# Patient Record
Sex: Female | Born: 1945
Health system: Southern US, Community
[De-identification: ages and names within clinical notes are randomized; demographics above are authoritative.]

## PROBLEM LIST (undated history)

## (undated) DIAGNOSIS — Z87442 Personal history of urinary calculi: Secondary | ICD-10-CM

## (undated) DIAGNOSIS — J330 Polyp of nasal cavity: Secondary | ICD-10-CM

## (undated) DIAGNOSIS — I10 Essential (primary) hypertension: Secondary | ICD-10-CM

## (undated) HISTORY — PX: TUBAL LIGATION: SHX77

---

## 2017-10-30 ENCOUNTER — Encounter: Payer: Self-pay | Admitting: Emergency Medicine

## 2017-10-30 ENCOUNTER — Emergency Department
Admission: EM | Admit: 2017-10-30 | Discharge: 2017-10-30 | Disposition: A | Discharge status: Home / Self Care | Payer: Medicare Other | Attending: Emergency Medicine | Admitting: Emergency Medicine

## 2017-10-30 ENCOUNTER — Telehealth: Payer: Self-pay | Admitting: Urology

## 2017-10-30 ENCOUNTER — Emergency Department: Payer: Medicare Other

## 2017-10-30 DIAGNOSIS — R1030 Lower abdominal pain, unspecified: Secondary | ICD-10-CM

## 2017-10-30 DIAGNOSIS — N23 Unspecified renal colic: Secondary | ICD-10-CM | POA: Insufficient documentation

## 2017-10-30 DIAGNOSIS — I10 Essential (primary) hypertension: Secondary | ICD-10-CM | POA: Insufficient documentation

## 2017-10-30 DIAGNOSIS — R112 Nausea with vomiting, unspecified: Secondary | ICD-10-CM

## 2017-10-30 DIAGNOSIS — K862 Cyst of pancreas: Secondary | ICD-10-CM

## 2017-10-30 HISTORY — DX: Essential (primary) hypertension: I10

## 2017-10-30 LAB — CBC
HCT: 42.5 % (ref 35.0–47.0)
Hemoglobin: 14.8 g/dL (ref 12.0–16.0)
MCH: 32 pg (ref 26.0–34.0)
MCHC: 34.9 g/dL (ref 32.0–36.0)
MCV: 91.8 fL (ref 80.0–100.0)
Platelets: 168 10*3/uL (ref 150–440)
RBC: 4.63 MIL/uL (ref 3.80–5.20)
RDW: 13.4 % (ref 11.5–14.5)
WBC: 10.4 10*3/uL (ref 3.6–11.0)

## 2017-10-30 LAB — COMPREHENSIVE METABOLIC PANEL
ALT: 18 U/L (ref 0–44)
AST: 22 U/L (ref 15–41)
Albumin: 4.5 g/dL (ref 3.5–5.0)
Alkaline Phosphatase: 57 U/L (ref 38–126)
Anion gap: 13 (ref 5–15)
BUN: 16 mg/dL (ref 8–23)
CO2: 26 mmol/L (ref 22–32)
Calcium: 9.8 mg/dL (ref 8.9–10.3)
Chloride: 101 mmol/L (ref 98–111)
Creatinine, Ser: 1.03 mg/dL — ABNORMAL HIGH (ref 0.44–1.00)
GFR calc Af Amer: >60 mL/min (ref >60)
GFR calc non Af Amer: 53 mL/min — ABNORMAL LOW (ref >60)
Glucose, Bld: 176 mg/dL — ABNORMAL HIGH (ref 70–99)
Potassium: 4.1 mmol/L (ref 3.5–5.1)
Sodium: 140 mmol/L (ref 135–145)
Total Bilirubin: 0.9 mg/dL (ref 0.3–1.2)
Total Protein: 7.2 g/dL (ref 6.5–8.1)

## 2017-10-30 LAB — URINALYSIS, COMPLETE (UACMP) WITH MICROSCOPIC
Bilirubin Urine: NEGATIVE
Glucose, UA: NEGATIVE mg/dL
Ketones, ur: 5 mg/dL — AB
Leukocytes, UA: NEGATIVE
Nitrite: NEGATIVE
Protein, ur: NEGATIVE mg/dL
RBC / HPF: >50 RBC/hpf — ABNORMAL HIGH (ref 0–5)
Specific Gravity, Urine: 1.027 (ref 1.005–1.030)
pH: 7 (ref 5.0–8.0)

## 2017-10-30 LAB — LIPASE, BLOOD: Lipase: 31 U/L (ref 11–51)

## 2017-10-30 MED ORDER — ONDANSETRON 4 MG PO TBDP: ORAL_TABLET | ORAL | 0 refills | Status: DC

## 2017-10-30 MED ORDER — ONDANSETRON HCL 4 MG/2ML IJ SOLN: 4.0000 mg | INTRAMUSCULAR | Status: DC

## 2017-10-30 MED ORDER — ONDANSETRON HCL 4 MG/2ML IJ SOLN
4.0000 mg | Freq: Once | INTRAMUSCULAR | Status: AC | PRN
  Administered 2017-10-30: 4 mg via INTRAVENOUS
  Filled 2017-10-30: qty 2

## 2017-10-30 MED ORDER — HYDROCODONE-ACETAMINOPHEN 5-325 MG PO TABS: 1.0000 | ORAL_TABLET | ORAL | 0 refills | Status: DC | PRN

## 2017-10-30 MED ORDER — TAMSULOSIN HCL 0.4 MG PO CAPS: ORAL_CAPSULE | ORAL | 0 refills | Status: DC

## 2017-10-30 MED ORDER — DOCUSATE SODIUM 100 MG PO CAPS: ORAL_CAPSULE | ORAL | 0 refills | Status: DC

## 2017-10-30 MED ORDER — IOPAMIDOL (ISOVUE-300) INJECTION 61%
100.0000 mL | Freq: Once | INTRAVENOUS | Status: AC | PRN
  Administered 2017-10-30: 100 mL via INTRAVENOUS
  Filled 2017-10-30: qty 100

## 2017-10-30 NOTE — Telephone Encounter (Signed)
This patient case was discussed with Dr. York CeriseForbach.  She presented with abdominal pain found to have an 8 millimeter UPJ stone.  No signs or symptoms of infection.  She is now appropriate for discharge.  Given the size and location of the stone, surgical intervention will likely be required.  Unfortunately, she is been taking daily Aleve thus ESWL is contraindicated for tomorrow.  She would be a good candidate for this next week.  Please arrange for her to be seen ASAP in our office by any provider prior to next Thursday for further evaluation.  He will discharge her home with pain medication, Flomax and Zofran with close urologic follow-up.  She was advised to avoid any NSAIDs at least 72 hours prior to next Thursday.  Alicia ScotlandAshley Bode Pieper, MD

## 2017-10-30 NOTE — Discharge Instructions (Addendum)
You have been seen in the Emergency Department (ED) today for pain that we believe based on your workup, is caused by kidney stones.  As we have discussed, please drink plenty of fluids.  Please make a follow up appointment with the physician(s) listed elsewhere in this documentation.  You may take pain medication as needed but ONLY as prescribed.  Please also take your prescribed Flomax daily.  We also recommend that you take over-the-counter ibuprofen regularly according to label instructions over the next 5 days.  Take it with meals to minimize stomach discomfort.  Please see your doctor as soon as possible as stones may take 1-3 weeks to pass and you may require additional care or medications.  Do not drink alcohol, drive or participate in any other potentially dangerous activities while taking opiate pain medication as it may make you sleepy. Do not take this medication with any other sedating medications, either prescription or over-the-counter. If you were prescribed Percocet or Vicodin, do not take these with acetaminophen (Tylenol) as it is already contained within these medications.   Take Norco as needed for severe pain.  This medication is an opiate (or narcotic) pain medication and can be habit forming.  Use it as little as possible to achieve adequate pain control.  Do not use or use it with extreme caution if you have a history of opiate abuse or dependence.  If you are on a pain contract with your primary care doctor or a pain specialist, be sure to let them know you were prescribed this medication today from the [facility].  This medication is intended for your use only - do not give any to anyone else and keep it in a secure place where nobody else, especially children, have access to it.  It will also cause or worsen constipation, so you may want to consider taking an over-the-counter stool softener while you are taking this medication.  As we discussed, you  also have a cyst on your pancreas.  This is likely benign, but the radiologist recommended that you have follow-up imaging (a pre-and postcontrast MRI/MRCP or pancreatic protocol CT scan) in 2 years to reevaluate the cyst to make sure it is not growing.  Please discuss this with your primary care provider.  Return to the Emergency Department (ED) or call your doctor if you have any worsening pain, fever, painful urination, are unable to urinate, or develop other symptoms that concern you.

## 2017-10-30 NOTE — ED Notes (Signed)
IV removed from pt left AC 

## 2017-10-30 NOTE — ED Notes (Signed)
Patient transported to CT 

## 2017-10-30 NOTE — ED Triage Notes (Signed)
PT arrives with complaints of lower abdominal pain, nausea, and vomiting. Pt reports 8 episodes of emesis. PT reports no relief with OTC medication. Pt states symptoms started last night

## 2017-10-30 NOTE — ED Provider Notes (Signed)
[facility] Emergency Department Provider Note  ____________________________________________   First MD Initiated Contact with Patient 10/30/17 1442     (approximate)  I have reviewed the triage vital signs and the nursing notes.   HISTORY  Chief Complaint Abdominal Pain    HPI Alicia Nelson is a 72 y.o. female who is quite healthy for her age and who is only known chronic diagnosis is hypertension who presents with her daughter for evaluation of acute onset abdominal pain with persistent vomiting.  The patient reports that for the last couple of days she has had some "on and off" aching in her abdomen.  However when she woke up this morning she had moderate lower abdominal pain that is dull and aching and has had at least 8 episodes of emesis.  She says she has not been able to keep anything down including sips of water.  She says that she had a normal bowel movement this morning before the abdominal pain started but has not had a bowel movement or passed any gas since it started.  She feels a little bit better after getting some Zofran in triage but says that she still has some mild aching and mild persistent nausea.    Her only prior abdominal surgery was a tubal ligation many years ago.  She denies recent fever, chest pain, shortness of breath, upper abdominal pain, dysuria, and vaginal bleeding.  No one else is sick and the symptoms did not seem to start after eating anything in particular.  Her bowel movement this morning was normal with no evidence of diarrhea.  No recent blood in stool.  Past Medical History:  Diagnosis Date  . Hypertension     There are no active problems to display for this patient.   History reviewed. No pertinent surgical history.  Prior to Admission medications   Medication Sig Start Date End Date Taking? Authorizing Provider  docusate sodium (COLACE) 100 MG capsule Take 1 tablet once or twice daily as needed for constipation  while taking narcotic pain medicine 10/30/17   Loleta Rose, MD  HYDROcodone-acetaminophen (NORCO/VICODIN) 5-325 MG tablet Take 1-2 tablets by mouth every 4 (four) hours as needed for moderate pain. 10/30/17   Loleta Rose, MD  ondansetron (ZOFRAN ODT) 4 MG disintegrating tablet Allow 1-2 tablets to dissolve in your mouth every 8 hours as needed for nausea/vomiting 10/30/17   Loleta Rose, MD  tamsulosin [facility]) 0.4 MG CAPS capsule Take 1 tablet by mouth daily until you pass the kidney stone or no longer have symptoms 10/30/17   Loleta Rose, MD    Allergies Patient has no allergy information on record.  No family history on file.  Social History Social History   Tobacco Use  . Smoking status: Never Smoker  . Smokeless tobacco: Never Used  Substance Use Topics  . Alcohol use: Never    Frequency: Never  . Drug use: Never    Review of Systems Constitutional: No fever/chills Eyes: No visual changes. ENT: No sore throat. Cardiovascular: Denies chest pain. Respiratory: Denies shortness of breath. Gastrointestinal: Abdominal pain with multiple episodes of vomiting as described above.  No diarrhea, normal bowel movement before pain but no passage of gas or stool since the onset of the pain and vomiting. Genitourinary: Negative for dysuria. Musculoskeletal: Negative for neck pain.  Negative for back pain. Integumentary: Negative for rash. Neurological: Negative for headaches, focal weakness or numbness.   ____________________________________________   PHYSICAL EXAM:  VITAL SIGNS: ED Triage Vitals  Enc Vitals Group     BP 10/30/17 1334 109/88     Pulse Rate 10/30/17 1334 75     Resp 10/30/17 1334 20     Temp 10/30/17 1334 (!) 97.3 F (36.3 C)     Temp Source 10/30/17 1334 Oral     SpO2 10/30/17 1334 99 %     Weight 10/30/17 1335 59 kg (130 lb)     Height 10/30/17 1335 1.575 m (5\' 2" )     Head Circumference --      Peak Flow --      Pain Score 10/30/17 1335 7     Pain  Loc --      Pain Edu? --      Excl. in GC? --     Constitutional: Alert and oriented.  Appears somewhat uncomfortable but is not in distress Eyes: Conjunctivae are normal.  Head: Atraumatic. Nose: No congestion/rhinnorhea. Mouth/Throat: Mucous membranes are moist. Neck: No stridor.  No meningeal signs.   Cardiovascular: Normal rate, regular rhythm. Good peripheral circulation. Grossly normal heart sounds. Respiratory: Normal respiratory effort.  No retractions. Lungs CTAB. Gastrointestinal: Soft and nondistended.  Mild generalized tenderness to palpation throughout without any focal tenderness. Musculoskeletal: No lower extremity tenderness nor edema. No gross deformities of extremities. Neurologic:  Normal speech and language. No gross focal neurologic deficits are appreciated.  Skin:  Skin is warm, dry and intact. No rash noted. Psychiatric: Mood and affect are normal. Speech and behavior are normal.  ____________________________________________   LABS (all labs ordered are listed, but only abnormal results are displayed)  Labs Reviewed  COMPREHENSIVE METABOLIC PANEL - Abnormal; Notable for the following components:      Result Value   Glucose, Bld 176 (*)    Creatinine, Ser 1.03 (*)    GFR calc non Af Amer 53 (*)    All other components within normal limits  URINALYSIS, COMPLETE (UACMP) WITH MICROSCOPIC - Abnormal; Notable for the following components:   Color, Urine YELLOW (*)    APPearance CLEAR (*)    Hgb urine dipstick SMALL (*)    Ketones, ur 5 (*)    RBC / HPF >50 (*)    Bacteria, UA RARE (*)    All other components within normal limits  LIPASE, BLOOD  CBC   ____________________________________________  EKG  None - EKG not ordered by ED physician ____________________________________________  RADIOLOGY   ED MD interpretation: 8 mm UPJ stone on the right with hydronephrosis.  Hepatic cysts (already known to the patient).  Pancreatic cyst as well of unknown  clinical significance.  It  Official radiology report(s): Ct Abdomen Pelvis W Contrast  Result Date: 10/30/2017 CLINICAL DATA:  Initial evaluation for acute bilateral lower abdominal pain, nausea, vomiting. EXAM: CT ABDOMEN AND PELVIS WITH CONTRAST TECHNIQUE: Multidetector CT imaging of the abdomen and pelvis was performed using the standard protocol following bolus administration of intravenous contrast. CONTRAST:  ISOVUE-300 IOPAMIDOL (ISOVUE-300) INJECTION 61% COMPARISON:  None available. FINDINGS: Lower chest: Scattered subsegmental atelectatic changes present within the visualized lung bases. Visualized lungs are otherwise clear. Prominent coronary artery calcifications noted. Hepatobiliary: Multiple cysts seen within the liver, largest of which positioned at the hepatic dome and measures 4.5 cm in size. Liver otherwise unremarkable. Gallbladder within normal limits. No biliary dilatation. Pancreas: Several small cystic lesions present within the pancreas, most discrete of which positioned at the pancreatic tail and measures up to 8 mm (series 2, image 33). No abnormal pancreatic ductal dilatation. No acute peripancreatic  inflammation. Spleen: Spleen within normal limits. Adrenals/Urinary Tract: Adrenal glands are normal. Kidneys equal in size. 3.4 cm cyst present within the interpolar right kidney. Few additional scattered subcentimeter hypodensities too small the characterize, but statistically likely reflects small cysts as well. No focal enhancing renal masses. Punctate nonobstructive stone present within the interpolar left kidney. On the right, there is an 8 mm obstructive stone lodged at the right UPJ with secondary moderate right hydronephrosis. Right ureter mildly dilated distally. Additional nonobstructive stones measuring up to 6 mm present within the right kidney. No other radiopaque calculi identified. Partially distended bladder within normal limits. No layering stones within the  bladder lumen. Stomach/Bowel: Large hiatal hernia with the majority of the stomach position within the lower thorax. Circumferential wall thickening about the gastric antrum, which could be related to vomiting or gastritis. No evidence for bowel obstruction. Colonic diverticulosis without evidence for acute diverticulitis. No other acute inflammatory changes about the bowels. No findings to suggest acute appendicitis. Vascular/Lymphatic: Diffuse tortuosity the intra-abdominal aorta with moderate aorto bi-iliac atherosclerotic disease. No aneurysm. Mesenteric vessels patent proximally. No adenopathy. Reproductive: Enlarged heterogeneous fibroid uterus, with large calcified fibroid measuring 8.1 x 5.2 x 5.7 cm. Uterus otherwise unremarkable. 13 mm right adnexal cyst noted, of doubtful significance. Ovaries otherwise unremarkable. Other: No free air or fluid. Musculoskeletal: Remotely healed fracture of the left superior and inferior pubic rami noted. Multiple chronic compression deformities seen throughout the visualized thoracolumbar spine. No acute osseous abnormality. No discrete lytic or blastic osseous lesions. Osteopenia noted. IMPRESSION: 1. 8 mm obstructive stone at the right UPJ with secondary moderate right hydronephrosis. Additional bilateral nonobstructive nephrolithiasis as above. 2. Large hiatal hernia with the majority of the stomach position within the lower thorax. Mild wall thickening about the gastric antrum may be related to vomiting and/or mild acute gastritis. 3. Colonic diverticulosis without evidence for acute diverticulitis. 4. Moderate to advanced atherosclerosis with coronary artery calcifications. 5. Fibroid uterus. 6. **An incidental finding of potential clinical significance has been found. Several small pancreatic cysts measuring up to 8 mm in diameter. Recommend follow up pre and post contrast MRI/MRCP or pancreatic protocol CT in 2 years. This recommendation follows ACR consensus  guidelines: Management of Incidental Pancreatic Cysts: A White Paper of the ACR Incidental Findings Committee. J Am Coll Radiol 2017;14:[id number].** Electronically Signed   By: Rise MuBenjamin  McClintock M.D.   On: 10/30/2017 16:01    ____________________________________________   PROCEDURES  Critical Care performed: No   Procedure(s) performed:   Procedures   ____________________________________________   INITIAL IMPRESSION / ASSESSMENT AND PLAN / ED COURSE  As part of my medical decision making, I reviewed the following data within the electronic MEDICAL RECORD NUMBER History obtained from family, Nursing notes reviewed and incorporated, Labs reviewed , Old chart reviewed, Discussed with urologist and reviewed Notes from prior ED visits    Differential diagnosis includes, but is not limited to, SBO/ileus, nonspecific gastritis, viral or bacterial GI infection, less likely but still possibly appendicitis or diverticulitis.  The patient on the acute onset and the description of an inability to keep anything down and not passing any gas or stool since the onset of the symptoms this morning, I strongly suspect  SBO, but it is notable that the patient has no distention and only minimal tenderness to palpation diffusely throughout her abdomen.  Given her inability to tolerate oral intake at this time, I am obtaining a CT scan with IV contrast only.  Her vital signs are all reassuring,  and her CMP, lipase, and CBC are all within normal limits.  She and her daughter understand and agree with the plan and I will reassess after imaging.  Clinical Course as of Oct 30 1801  Wed Oct 30, 2017  1700 CT scan is notable for an 8 mm UPJ stone on the right with hydronephrosis.  Pancreatic cyst as well and I let the patient and her daughter know about it for follow-up in 2 years as recommended by radiology.  The patient Artie knew about her hepatic cysts.The patient is feeling much better and I am going to try p.o.  challenge to see if she would be able to tolerate outpatient treatment.  I am going to touch base with urology to see if the patient would be eligible for lithotripsy tomorrow in clinic.   [CF]  1705 No vomiting after PO challenge, patient says she feel much better.  Awaiting callback from Dr. Apolinar Junes.   [CF]  1730 I spoke with Dr. Apolinar Junes by phone.  Unfortunately the patient took an Aleve this morning, and she has to be without NSAIDs for 72 hours before lithotripsy due to the position of the stone.  Dr. Apolinar Junes will let her office staff know to reach out to schedule a close follow-up appointment and then will plan for lithotripsy next week.  She had no other management recommendations at this time and agree that empiric antibiotics were not necessary based on the information I provided.I have dated the patient and her daughter and stressed the importance of not taking any NSAIDs (ibuprofen, naproxen, nor aspirin) after Sunday.  I also gave my usual customary return precautions.  They understand and agree.   [CF]    Clinical Course User Index [CF] Derico Mitton, MD    ____________________________________________  FINAL CLINICAL IMPRESSION(S) / ED DIAGNOSES  Final diagnoses:  Ureteral colic  Non-intractable vomiting with nausea, unspecified vomiting type  Lower abdominal pain  Pancreatic cyst     MEDICATIONS GIVEN DURING THIS VISIT:  Medications  ondansetron (ZOFRAN) injection 4 mg (4 mg Intravenous Given 10/30/17 1340)  ondansetron (ZOFRAN) injection 4 mg (4 mg Intravenous Given 10/30/17 1500)  iopamidol (ISOVUE-300) 61 % injection 100 mL (100 mLs Intravenous Contrast Given 10/30/17 1505)     ED Discharge Orders         Ordered    ondansetron (ZOFRAN ODT) 4 MG disintegrating tablet     10/30/17 1800    HYDROcodone-acetaminophen (NORCO/VICODIN) 5-325 MG tablet  Every 4 hours PRN     10/30/17 1800    docusate sodium (COLACE) 100 MG capsule     10/30/17 1800    tamsulosin (FLOMAX)  0.4 MG CAPS capsule     08 /28/19 1800           Note:  This document was prepared using Dragon voice recognition software and may include unintentional dictation errors.    Loleta Rose, MD 10/30/17 [phone]

## 2017-11-05 NOTE — Telephone Encounter (Signed)
App has been made ° ° °Alicia Nelson °

## 2017-11-06 ENCOUNTER — Ambulatory Visit: Payer: Medicare HMO | Admitting: Urology

## 2017-11-06 ENCOUNTER — Encounter: Payer: Self-pay | Admitting: Urology

## 2017-11-06 ENCOUNTER — Other Ambulatory Visit: Payer: Self-pay | Admitting: Radiology

## 2017-11-06 VITALS — BP 91/59 | HR 85 | Ht 62.0 in | Wt 130.0 lb

## 2017-11-06 DIAGNOSIS — N201 Calculus of ureter: Secondary | ICD-10-CM

## 2017-11-06 DIAGNOSIS — N1330 Unspecified hydronephrosis: Secondary | ICD-10-CM | POA: Diagnosis not present

## 2017-11-06 DIAGNOSIS — N200 Calculus of kidney: Secondary | ICD-10-CM | POA: Diagnosis not present

## 2017-11-06 LAB — URINALYSIS, COMPLETE
Bilirubin, UA: NEGATIVE
Glucose, UA: NEGATIVE
Nitrite, UA: NEGATIVE
Specific Gravity, UA: 1.02 (ref 1.005–1.030)
Urobilinogen, Ur: 0.2 mg/dL (ref 0.2–1.0)
pH, UA: 5.5 (ref 5.0–7.5)

## 2017-11-06 LAB — MICROSCOPIC EXAMINATION

## 2017-11-06 MED ORDER — CIPROFLOXACIN HCL 500 MG PO TABS
500.0000 mg | ORAL_TABLET | ORAL | Status: AC
  Administered 2017-11-07: 500 mg via ORAL

## 2017-11-06 NOTE — Progress Notes (Signed)
11/06/2017 4:47 PM   Alicia Nelson 01/29/1946 [id number]  Referring provider: No referring provider defined for this encounter.  Chief Complaint  Patient presents with  . Nephrolithiasis    discuss ESWL    HPI: 72 year old female who presents today to discuss management of 8 mm right UPJ stone with moderate right hydroureteronephrosis.  She was seen last week in the emergency room with bilateral abdominal pain nausea and vomiting.  As part of her work-up for this, she underwent CT abdomen with contrast looking for abdominal sources.  This revealed an incidental 8 mm right obstructing UPJ stone with secondary moderate right hydroureteronephrosis.  She does have additional bilateral nonobstructing stones.  She last had right flank pain on Monday.  She does not like taking pain pills and she feels like the pain pills meter sicker than the pain from the stone itself.  She denies any fevers, chills, dysuria, gross hematuria, or any other complaints other than as per above.  Hounsfield units 750.  Stone to skin distance 9 cm.  No personal history history of kidney stones.    PMH: Past Medical History:  Diagnosis Date  . Hypertension     Surgical History: No past surgical history on file.  Home Medications:  Allergies as of 11/06/2017   Not on File     Medication List        Accurate as of 11/06/17  4:47 PM. Always use your most recent med list.          docusate sodium 100 MG capsule Commonly known as:  COLACE Take 1 tablet once or twice daily as needed for constipation while taking narcotic pain medicine   HYDROcodone-acetaminophen 5-325 MG tablet Commonly known as:  NORCO/VICODIN Take 1-2 tablets by mouth every 4 (four) hours as needed for moderate pain.   ondansetron 4 MG disintegrating tablet Commonly known as:  ZOFRAN-ODT Allow 1-2 tablets to dissolve in your mouth every 8 hours as needed for nausea/vomiting   tamsulosin 0.4 MG Caps capsule Commonly known as:   FLOMAX Take 1 tablet by mouth daily until you pass the kidney stone or no longer have symptoms       Allergies: Not on File  Family History: No family history on file.  Social History:  reports that she has never smoked. She has never used smokeless tobacco. She reports that she does not drink alcohol or use drugs.  ROS: UROLOGY Frequent Urination?: No Hard to postpone urination?: No Burning/pain with urination?: No Get up at night to urinate?: No Leakage of urine?: No Urine stream starts and stops?: No Trouble starting stream?: No Do you have to strain to urinate?: No Blood in urine?: No Urinary tract infection?: No Sexually transmitted disease?: No Injury to kidneys or bladder?: No Painful intercourse?: No Weak stream?: No Currently pregnant?: No Vaginal bleeding?: No Last menstrual period?: n  Gastrointestinal Nausea?: No Vomiting?: No Indigestion/heartburn?: No Diarrhea?: No Constipation?: No  Constitutional Fever: No Night sweats?: No Weight loss?: No Fatigue?: No  Skin Skin rash/lesions?: No Itching?: No  Eyes Blurred vision?: No Double vision?: No  Ears/Nose/Throat Sore throat?: No Sinus problems?: No  Hematologic/Lymphatic Swollen glands?: No Easy bruising?: No  Cardiovascular Leg swelling?: No Chest pain?: No  Respiratory Cough?: No Shortness of breath?: No  Endocrine Excessive thirst?: No  Musculoskeletal Back pain?: No Joint pain?: No  Neurological Headaches?: No Dizziness?: No  Psychologic Depression?: No Anxiety?: No  Physical Exam: BP (!) 91/59   Pulse 85   Ht  5\' 2"  (1.575 m)   Wt 130 lb (59 kg)   BMI 23.78 kg/m   Constitutional:  Alert and oriented, No acute distress.  Accompanied by daughter today. HEENT: [place] AT, moist mucus membranes.  Trachea midline, no masses. Cardiovascular: No clubbing, cyanosis, or edema. Respiratory: Normal respiratory effort, no increased work of breathing. GI: Abdomen is soft,  nontender, nondistended, no abdominal masses GU: No CVA tenderness Skin: No rashes, bruises or suspicious lesions. Neurologic: Grossly intact, no focal deficits, moving all 4 extremities. Psychiatric: Normal mood and affect.  Laboratory Data: Lab Results  Component Value Date   WBC 10.4 10/30/2017   HGB 14.8 10/30/2017   HCT 42.5 10/30/2017   MCV 91.8 10/30/2017   PLT 168 10/30/2017    Lab Results  Component Value Date   CREATININE 1.03 (H) 10/30/2017    Urinalysis Results for orders placed or performed in visit on 11/06/17  Microscopic Examination  Result Value Ref Range   WBC, UA 6-10 (A) 0 - 5 /hpf   RBC, UA 3-10 (A) 0 - 2 /hpf   Epithelial Cells (non renal) 0-10 0 - 10 /hpf   Renal Epithel, UA 0-10 (A) None seen /hpf   Casts Present (A) None seen /lpf   Cast Type Hyaline casts N/A   Crystals Present (A) N/A   Crystal Type Calcium Oxalate N/A   Mucus, UA Present (A) Not Estab.   Bacteria, UA Moderate (A) None seen/Few  Urinalysis, Complete  Result Value Ref Range   Specific Gravity, UA 1.020 1.005 - 1.030   pH, UA 5.5 5.0 - 7.5   Color, UA Yellow Yellow   Appearance Ur Clear Clear   Leukocytes, UA 1+ (A) Negative   Protein, UA 1+ (A) Negative/Trace   Glucose, UA Negative Negative   Ketones, UA 1+ (A) Negative   RBC, UA Trace (A) Negative   Bilirubin, UA Negative Negative   Urobilinogen, Ur 0.2 0.2 - 1.0 mg/dL   Nitrite, UA Negative Negative   Microscopic Examination See below:      Pertinent Imaging: CLINICAL DATA:  Initial evaluation for acute bilateral lower abdominal pain, nausea, vomiting.  EXAM: CT ABDOMEN AND PELVIS WITH CONTRAST  TECHNIQUE: Multidetector CT imaging of the abdomen and pelvis was performed using the standard protocol following bolus administration of intravenous contrast.  CONTRAST:  ISOVUE-300 IOPAMIDOL (ISOVUE-300) INJECTION 61%  COMPARISON:  None available.  FINDINGS: Lower chest: Scattered subsegmental  atelectatic changes present within the visualized lung bases. Visualized lungs are otherwise clear. Prominent coronary artery calcifications noted.  Hepatobiliary: Multiple cysts seen within the liver, largest of which positioned at the hepatic dome and measures 4.5 cm in size. Liver otherwise unremarkable. Gallbladder within normal limits. No biliary dilatation.  Pancreas: Several small cystic lesions present within the pancreas, most discrete of which positioned at the pancreatic tail and measures up to 8 mm (series 2, image 33). No abnormal pancreatic ductal dilatation. No acute peripancreatic inflammation.  Spleen: Spleen within normal limits.  Adrenals/Urinary Tract: Adrenal glands are normal. Kidneys equal in size. 3.4 cm cyst present within the interpolar right kidney. Few additional scattered subcentimeter hypodensities too small the characterize, but statistically likely reflects small cysts as well. No focal enhancing renal masses. Punctate nonobstructive stone present within the interpolar left kidney. On the right, there is an 8 mm obstructive stone lodged at the right UPJ with secondary moderate right hydronephrosis. Right ureter mildly dilated distally. Additional nonobstructive stones measuring up to 6 mm present within the right  kidney. No other radiopaque calculi identified. Partially distended bladder within normal limits. No layering stones within the bladder lumen.  Stomach/Bowel: Large hiatal hernia with the majority of the stomach position within the lower thorax. Circumferential wall thickening about the gastric antrum, which could be related to vomiting or gastritis. No evidence for bowel obstruction. Colonic diverticulosis without evidence for acute diverticulitis. No other acute inflammatory changes about the bowels. No findings to suggest acute appendicitis.  Vascular/Lymphatic: Diffuse tortuosity the intra-abdominal aorta with moderate aorto  bi-iliac atherosclerotic disease. No aneurysm. Mesenteric vessels patent proximally. No adenopathy.  Reproductive: Enlarged heterogeneous fibroid uterus, with large calcified fibroid measuring 8.1 x 5.2 x 5.7 cm. Uterus otherwise unremarkable. 13 mm right adnexal cyst noted, of doubtful significance. Ovaries otherwise unremarkable.  Other: No free air or fluid.  Musculoskeletal: Remotely healed fracture of the left superior and inferior pubic rami noted. Multiple chronic compression deformities seen throughout the visualized thoracolumbar spine. No acute osseous abnormality. No discrete lytic or blastic osseous lesions. Osteopenia noted.  IMPRESSION: 1. 8 mm obstructive stone at the right UPJ with secondary moderate right hydronephrosis. Additional bilateral nonobstructive nephrolithiasis as above. 2. Large hiatal hernia with the majority of the stomach position within the lower thorax. Mild wall thickening about the gastric antrum may be related to vomiting and/or mild acute gastritis. 3. Colonic diverticulosis without evidence for acute diverticulitis. 4. Moderate to advanced atherosclerosis with coronary artery calcifications. 5. Fibroid uterus. 6. **An incidental finding of potential clinical significance has been found. Several small pancreatic cysts measuring up to 8 mm in diameter. Recommend follow up pre and post contrast MRI/MRCP or pancreatic protocol CT in 2 years. This recommendation follows ACR consensus guidelines: Management of Incidental Pancreatic Cysts: A White Paper of the ACR Incidental Findings Committee. J Am Coll Radiol 2017;14:911-923.**   Electronically Signed   By: Rise Mu M.D.   On: 10/30/2017 16:01  CT scan was personally reviewed today.  Assessment & Plan:    1. Obstruction of right ureteropelvic junction (UPJ) due to stone Symptomatic obstructing 8 mm right UPJ stone without concern for infection Given the size and  location of the stone, I would recommend surgical intervention this is unlikely to pass on its own with medical expulsive therapy We discussed various treatment options including ESWL vs. ureteroscopy, laser lithotripsy, and stent. We discussed the risks and benefits of both including bleeding, infection, damage to surrounding structures, efficacy with need for possible further intervention, and need for temporary ureteral stent. She is most interested shockwave lithotripsy given that is minimally invasive.  She is an excellent candidate for this based on the stone parameters, her habitus, and the location of the stone. All questions were answered today in detail.  Lithotripsy paperwork was completed. - Urinalysis, Complete - CULTURE, URINE COMPREHENSIVE  2. Hydronephrosis of right kidney Secondary to #1  Vanna Scotland, MD  [facility] [address], [address] [place], [place] [address] [phone]

## 2017-11-07 ENCOUNTER — Encounter
Admission: RE | Disposition: A | Discharge status: Home / Self Care | Payer: Self-pay | Source: Ambulatory Visit | Attending: Urology

## 2017-11-07 ENCOUNTER — Other Ambulatory Visit: Payer: Self-pay

## 2017-11-07 ENCOUNTER — Encounter: Payer: Self-pay | Admitting: *Deleted

## 2017-11-07 ENCOUNTER — Ambulatory Visit: Payer: Medicare HMO

## 2017-11-07 ENCOUNTER — Ambulatory Visit
Admission: RE | Admit: 2017-11-07 | Discharge: 2017-11-07 | Disposition: A | Discharge status: Home / Self Care | Payer: Medicare HMO | Source: Ambulatory Visit | Attending: Urology | Admitting: Urology

## 2017-11-07 DIAGNOSIS — N201 Calculus of ureter: Secondary | ICD-10-CM | POA: Diagnosis not present

## 2017-11-07 DIAGNOSIS — I10 Essential (primary) hypertension: Secondary | ICD-10-CM | POA: Insufficient documentation

## 2017-11-07 DIAGNOSIS — N132 Hydronephrosis with renal and ureteral calculous obstruction: Secondary | ICD-10-CM | POA: Diagnosis not present

## 2017-11-07 DIAGNOSIS — N200 Calculus of kidney: Secondary | ICD-10-CM | POA: Diagnosis present

## 2017-11-07 HISTORY — PX: EXTRACORPOREAL SHOCK WAVE LITHOTRIPSY: SHX1557

## 2017-11-07 SURGERY — LITHOTRIPSY, ESWL
Anesthesia: Moderate Sedation | Laterality: Right

## 2017-11-07 MED ORDER — ONDANSETRON HCL 4 MG/2ML IJ SOLN
4.0000 mg | Freq: Once | INTRAMUSCULAR | Status: AC | PRN
  Administered 2017-11-07: 4 mg via INTRAVENOUS

## 2017-11-07 MED ORDER — CIPROFLOXACIN HCL 500 MG PO TABS
ORAL_TABLET | ORAL | Status: AC
  Administered 2017-11-07: 500 mg via ORAL
  Filled 2017-11-07: qty 1

## 2017-11-07 MED ORDER — DIAZEPAM 5 MG PO TABS
10.0000 mg | ORAL_TABLET | ORAL | Status: AC
  Administered 2017-11-07: 10 mg via ORAL

## 2017-11-07 MED ORDER — DIPHENHYDRAMINE HCL 25 MG PO CAPS
ORAL_CAPSULE | ORAL | Status: AC
  Administered 2017-11-07: 25 mg via ORAL
  Filled 2017-11-07: qty 1

## 2017-11-07 MED ORDER — SODIUM CHLORIDE 0.9 % IV SOLN
INTRAVENOUS | Status: DC
  Administered 2017-11-07: 09:00:00 via INTRAVENOUS

## 2017-11-07 MED ORDER — DIPHENHYDRAMINE HCL 25 MG PO CAPS
25.0000 mg | ORAL_CAPSULE | ORAL | Status: AC
  Administered 2017-11-07: 25 mg via ORAL

## 2017-11-07 MED ORDER — DIAZEPAM 5 MG PO TABS
ORAL_TABLET | ORAL | Status: AC
  Administered 2017-11-07: 10 mg via ORAL
  Filled 2017-11-07: qty 2

## 2017-11-07 MED ORDER — ONDANSETRON HCL 4 MG/2ML IJ SOLN
INTRAMUSCULAR | Status: AC
  Administered 2017-11-07: 4 mg via INTRAVENOUS
  Filled 2017-11-07: qty 2

## 2017-11-07 NOTE — H&P (View-Only) (Signed)
11/07/17  Patient was taken for shockwave lithotripsy today.  However, the stone was unable to be identified.  This was either secondary to interval passage of the stone although the stone could have been obstructed by a large amount of bowel gas as well as additional calcifications within the abdomen.  Contrast was administered to help identify the stone.  This revealed persistent moderate hydroureteronephrosis all the way down to the level of a fibroid which may be obstructive.  In retrospect on CT scan, her ureter was dilated even distal to the stone.  I discussed this with the patient but primarily her daughter at length today.  I would like to take her to the operating room for cystoscopy, right retrograde pyelogram, ureteroscopy with possible laser lithotripsy for retained stone is identified as well as ureteral stent placement.  If she does have hydronephrosis secondary to a compressive fibroid, she will be referred to GYN for consideration of resection of this.  All questions were answered.  Follow-up surgery was booked.  Vanna Scotland, MD

## 2017-11-07 NOTE — Progress Notes (Signed)
11/07/17  Patient was taken for shockwave lithotripsy today.  However, the stone was unable to be identified.  This was either secondary to interval passage of the stone although the stone could have been obstructed by a large amount of bowel gas as well as additional calcifications within the abdomen.  Contrast was administered to help identify the stone.  This revealed persistent moderate hydroureteronephrosis all the way down to the level of a fibroid which may be obstructive.  In retrospect on CT scan, her ureter was dilated even distal to the stone.  I discussed this with the patient but primarily her daughter at length today.  I would like to take her to the operating room for cystoscopy, right retrograde pyelogram, ureteroscopy with possible laser lithotripsy for retained stone is identified as well as ureteral stent placement.  If she does have hydronephrosis secondary to a compressive fibroid, she will be referred to GYN for consideration of resection of this.  All questions were answered.  Follow-up surgery was booked.  Kairee Isa, MD  

## 2017-11-07 NOTE — Interval H&P Note (Signed)
History and Physical Interval Note:  11/07/2017 12:27 PM  Alicia Nelson  has presented today for surgery, with the diagnosis of Kidney stone  The various methods of treatment have been discussed with the patient and family. After consideration of risks, benefits and other options for treatment, the patient has consented to  Procedure(s): EXTRACORPOREAL SHOCK WAVE LITHOTRIPSY (ESWL) (Right) as a surgical intervention .  The patient's history has been reviewed, patient examined, no change in status, stable for surgery.  I have reviewed the patient's chart and labs.  Questions were answered to the patient's satisfaction.     Vanna Scotland

## 2017-11-07 NOTE — H&P (View-Only) (Signed)
11/07/17  Patient was taken for shockwave lithotripsy today.  However, the stone was unable to be identified.  This was either secondary to interval passage of the stone although the stone could have been obstructed by a large amount of bowel gas as well as additional calcifications within the abdomen.  Contrast was administered to help identify the stone.  This revealed persistent moderate hydroureteronephrosis all the way down to the level of a fibroid which may be obstructive.  In retrospect on CT scan, her ureter was dilated even distal to the stone.  I discussed this with the patient but primarily her daughter at length today.  I would like to take her to the operating room for cystoscopy, right retrograde pyelogram, ureteroscopy with possible laser lithotripsy for retained stone is identified as well as ureteral stent placement.  If she does have hydronephrosis secondary to a compressive fibroid, she will be referred to GYN for consideration of resection of this.  All questions were answered.  Follow-up surgery was booked.  Adalin Vanderploeg, MD  

## 2017-11-07 NOTE — Discharge Instructions (Signed)

## 2017-11-08 LAB — CULTURE, URINE COMPREHENSIVE

## 2017-11-11 ENCOUNTER — Telehealth: Payer: Self-pay | Admitting: Radiology

## 2017-11-11 ENCOUNTER — Other Ambulatory Visit: Payer: Self-pay | Admitting: Radiology

## 2017-11-11 DIAGNOSIS — N1330 Unspecified hydronephrosis: Secondary | ICD-10-CM

## 2017-11-11 NOTE — Telephone Encounter (Signed)
LMOM to notify patient of right ureteroscopy, stent placement, retrograde pyelogram & possible laser lithotripsy scheduled with Dr Apolinar Junes on 11/18/2017 as well as pre-admit testing appointment on 11/14/2017 at 8:00.

## 2017-11-13 ENCOUNTER — Encounter: Payer: Self-pay | Admitting: Urology

## 2017-11-13 ENCOUNTER — Telehealth: Payer: Self-pay | Admitting: Urology

## 2017-11-13 NOTE — Telephone Encounter (Signed)
Discussed appointments with patient. Questions answered. Patient voices understanding.

## 2017-11-13 NOTE — Telephone Encounter (Signed)
Insurance Authorization for Outpatient Surgery CPT [phone] - Right ureterscopy, stent placement CPT [phone] - Right retrograde pyelogram CPT 52356 - possible LL Diag: N13.30 = right hydronephrosis DOS: 11/18/17 Dr. Vanna Scotland  Per [employer] (#[phone]), pre-certification is NOT required. Reference # [id number] [facility] 11/13/2017 10:20am

## 2017-11-14 ENCOUNTER — Encounter
Admission: RE | Admit: 2017-11-14 | Discharge: 2017-11-14 | Disposition: A | Discharge status: Home / Self Care | Payer: Medicare HMO | Source: Ambulatory Visit | Attending: Urology | Admitting: Urology

## 2017-11-14 ENCOUNTER — Other Ambulatory Visit: Payer: Self-pay

## 2017-11-14 DIAGNOSIS — I10 Essential (primary) hypertension: Secondary | ICD-10-CM | POA: Diagnosis not present

## 2017-11-14 HISTORY — DX: Polyp of nasal cavity: J33.0

## 2017-11-14 HISTORY — DX: Personal history of urinary calculi: Z87.442

## 2017-11-14 NOTE — Patient Instructions (Addendum)
Your procedure is scheduled on: 11/18/17 Mon Report to Same Day Surgery 2nd floor medical mall [facility](Medical Mall Entrance-take elevator on left to 2nd floor.  Check in with surgery information desk.) To find out your arrival time please call [phone] between 1PM - 3PM on 11/15/17 Fri  Remember: Instructions that are not followed completely may result in serious medical risk, up to and including death, or upon the discretion of your surgeon and anesthesiologist your surgery may need to be rescheduled.    _x___ 1. Do not eat food after midnight the night before your procedure. You may drink clear liquids up to 2 hours before you are scheduled to arrive at the hospital for your procedure.  Do not drink clear liquids within 2 hours of your scheduled arrival to the hospital.  Clear liquids include  --Water or Apple juice without pulp  --Clear carbohydrate beverage such as ClearFast or Gatorade  --Black Coffee or Clear Tea (No milk, no creamers, do not add anything to                  the coffee or Tea Type 1 and type 2 diabetics should only drink water.   ____Ensure clear carbohydrate drink on the way to the hospital for bariatric patients  ____Ensure clear carbohydrate drink 3 hours before surgery for Dr Rutherford NailByrnett's patients if physician instructed.   No gum chewing or hard candies.     __x__ 2. No Alcohol for 24 hours before or after surgery.   __x__3. No Smoking or e-cigarettes for 24 prior to surgery.  Do not use any chewable tobacco products for at least 6 hour prior to surgery   ____  4. Bring all medications with you on the day of surgery if instructed.    __x__ 5. Notify your doctor if there is any change in your medical condition     (cold, fever, infections).    x___6. On the morning of surgery brush your teeth with toothpaste and water.  You may rinse your mouth with mouth wash if you wish.  Do not swallow any toothpaste or mouthwash.   Do not wear jewelry, make-up, hairpins,  clips or nail polish.  Do not wear lotions, powders, or perfumes. You may wear deodorant.  Do not shave 48 hours prior to surgery. Men may shave face and neck.  Do not bring valuables to the hospital.    [facility] not responsible for any belongings or valuables.               Contacts, dentures or bridgework may not be worn into surgery.  Leave your suitcase in the car. After surgery it may be brought to your room.  For patients admitted to the hospital, discharge time is determined by your                       treatment team.  _  Patients discharged the day of surgery will not be allowed to drive home.  You will need someone to drive you home and stay with you the night of your procedure.    Please read over the following fact sheets that you were given:   [facility] Preparing for Surgery and or MRSA Information   _x___ Take anti-hypertensive listed below, cardiac, seizure, asthma,     anti-reflux and psychiatric medicines. These include:  1. None  2.  3.  4.  5.  6.  ____Fleets enema or Magnesium Citrate as directed.  ____ Use CHG Soap or sage wipes as directed on instruction sheet   ____ Use inhalers on the day of surgery and bring to hospital day of surgery  ____ Stop Metformin and Janumet 2 days prior to surgery.    ____ Take 1/2 of usual insulin dose the night before surgery and none on the morning     surgery.   _x___ Follow recommendations from Cardiologist, Pulmonologist or PCP regarding          stopping Aspirin, Coumadin, Plavix ,Eliquis, Effient, or Pradaxa, and Pletal.  X____Stop Anti-inflammatories such as Advil, Aleve, Ibuprofen, Motrin, Naproxen, Naprosyn, Goodies powders or aspirin products. OK to take Tylenol and                          Celebrex.   _x___ Stop supplements until after surgery.  But may continue Vitamin D, Vitamin B,       and multivitamin.   ____ Bring C-Pap to the hospital.

## 2017-11-17 MED ORDER — CEFAZOLIN SODIUM-DEXTROSE 2-4 GM/100ML-% IV SOLN
2.0000 g | INTRAVENOUS | Status: AC
  Administered 2017-11-18: 2 g via INTRAVENOUS

## 2017-11-18 ENCOUNTER — Ambulatory Visit: Payer: Medicare HMO | Admitting: Anesthesiology

## 2017-11-18 ENCOUNTER — Other Ambulatory Visit: Payer: Self-pay

## 2017-11-18 ENCOUNTER — Ambulatory Visit
Admission: RE | Admit: 2017-11-18 | Discharge: 2017-11-18 | Disposition: A | Discharge status: Home / Self Care | Payer: Medicare HMO | Source: Ambulatory Visit | Attending: Urology | Admitting: Urology

## 2017-11-18 ENCOUNTER — Encounter: Payer: Self-pay | Admitting: Emergency Medicine

## 2017-11-18 ENCOUNTER — Encounter
Admission: RE | Disposition: A | Discharge status: Home / Self Care | Payer: Self-pay | Source: Ambulatory Visit | Attending: Urology

## 2017-11-18 DIAGNOSIS — N132 Hydronephrosis with renal and ureteral calculous obstruction: Secondary | ICD-10-CM | POA: Insufficient documentation

## 2017-11-18 DIAGNOSIS — I10 Essential (primary) hypertension: Secondary | ICD-10-CM | POA: Diagnosis not present

## 2017-11-18 DIAGNOSIS — Z79899 Other long term (current) drug therapy: Secondary | ICD-10-CM | POA: Insufficient documentation

## 2017-11-18 DIAGNOSIS — N1330 Unspecified hydronephrosis: Secondary | ICD-10-CM | POA: Diagnosis not present

## 2017-11-18 HISTORY — PX: CYSTOSCOPY/URETEROSCOPY/HOLMIUM LASER/STENT PLACEMENT: SHX6546

## 2017-11-18 HISTORY — PX: CYSTOSCOPY W/ RETROGRADES: SHX1426

## 2017-11-18 SURGERY — CYSTOSCOPY, WITH RETROGRADE PYELOGRAM
Anesthesia: General | Site: Ureter | Laterality: Right

## 2017-11-18 MED ORDER — HYDROCODONE-ACETAMINOPHEN 5-325 MG PO TABS: 1.0000 | ORAL_TABLET | ORAL | 0 refills | Status: DC | PRN

## 2017-11-18 MED ORDER — PROPOFOL 10 MG/ML IV BOLUS
INTRAVENOUS | Status: DC | PRN
  Administered 2017-11-18: 140 mg via INTRAVENOUS

## 2017-11-18 MED ORDER — IOPAMIDOL (ISOVUE-200) INJECTION 41%
INTRAVENOUS | Status: DC | PRN
  Administered 2017-11-18: 15 mL

## 2017-11-18 MED ORDER — FAMOTIDINE 20 MG PO TABS
ORAL_TABLET | ORAL | Status: AC
  Filled 2017-11-18: qty 1

## 2017-11-18 MED ORDER — FENTANYL CITRATE (PF) 100 MCG/2ML IJ SOLN
INTRAMUSCULAR | Status: AC
  Filled 2017-11-18: qty 2

## 2017-11-18 MED ORDER — PHENYLEPHRINE HCL 10 MG/ML IJ SOLN
INTRAMUSCULAR | Status: DC | PRN
  Administered 2017-11-18 (×2): 50 ug via INTRAVENOUS

## 2017-11-18 MED ORDER — ONDANSETRON HCL 4 MG/2ML IJ SOLN: 4.0000 mg | Freq: Once | INTRAMUSCULAR | Status: DC | PRN

## 2017-11-18 MED ORDER — SUGAMMADEX SODIUM 200 MG/2ML IV SOLN
INTRAVENOUS | Status: DC | PRN
  Administered 2017-11-18: 100 mg via INTRAVENOUS

## 2017-11-18 MED ORDER — CEFAZOLIN SODIUM-DEXTROSE 2-4 GM/100ML-% IV SOLN
INTRAVENOUS | Status: AC
  Filled 2017-11-18: qty 100

## 2017-11-18 MED ORDER — DEXAMETHASONE SODIUM PHOSPHATE 10 MG/ML IJ SOLN
INTRAMUSCULAR | Status: DC | PRN
  Administered 2017-11-18: 5 mg via INTRAVENOUS

## 2017-11-18 MED ORDER — LACTATED RINGERS IV SOLN
INTRAVENOUS | Status: DC
  Administered 2017-11-18 (×2): via INTRAVENOUS

## 2017-11-18 MED ORDER — FAMOTIDINE 20 MG PO TABS
20.0000 mg | ORAL_TABLET | Freq: Once | ORAL | Status: AC
  Administered 2017-11-18: 20 mg via ORAL

## 2017-11-18 MED ORDER — FENTANYL CITRATE (PF) 100 MCG/2ML IJ SOLN
INTRAMUSCULAR | Status: AC
  Administered 2017-11-18: 25 ug via INTRAVENOUS
  Filled 2017-11-18: qty 2

## 2017-11-18 MED ORDER — PROPOFOL 10 MG/ML IV BOLUS
INTRAVENOUS | Status: AC
  Filled 2017-11-18: qty 20

## 2017-11-18 MED ORDER — ONDANSETRON HCL 4 MG/2ML IJ SOLN
INTRAMUSCULAR | Status: DC | PRN
  Administered 2017-11-18: 4 mg via INTRAVENOUS

## 2017-11-18 MED ORDER — ROCURONIUM BROMIDE 100 MG/10ML IV SOLN
INTRAVENOUS | Status: DC | PRN
  Administered 2017-11-18: 35 mg via INTRAVENOUS

## 2017-11-18 MED ORDER — LIDOCAINE HCL (CARDIAC) PF 100 MG/5ML IV SOSY
PREFILLED_SYRINGE | INTRAVENOUS | Status: DC | PRN
  Administered 2017-11-18: 80 mg via INTRAVENOUS

## 2017-11-18 MED ORDER — FENTANYL CITRATE (PF) 100 MCG/2ML IJ SOLN
INTRAMUSCULAR | Status: DC | PRN
  Administered 2017-11-18 (×2): 50 ug via INTRAVENOUS

## 2017-11-18 MED ORDER — FENTANYL CITRATE (PF) 100 MCG/2ML IJ SOLN
25.0000 ug | INTRAMUSCULAR | Status: DC | PRN
  Administered 2017-11-18 (×2): 25 ug via INTRAVENOUS

## 2017-11-18 SURGICAL SUPPLY — 29 items
BAG DRAIN CYSTO-URO LG1000N (MISCELLANEOUS) ×3 IMPLANT
BASKET ZERO TIP 1.9FR (BASKET) ×3 IMPLANT
BRUSH SCRUB EZ  4% CHG (MISCELLANEOUS) ×1
BRUSH SCRUB EZ 1% IODOPHOR (MISCELLANEOUS) ×3 IMPLANT
BRUSH SCRUB EZ 4% CHG (MISCELLANEOUS) ×2 IMPLANT
CATH URETL 5X70 OPEN END (CATHETERS) ×3 IMPLANT
CNTNR SPEC 2.5X3XGRAD LEK (MISCELLANEOUS) ×2
CONT SPEC 4OZ STER OR WHT (MISCELLANEOUS) ×1
CONTAINER SPEC 2.5X3XGRAD LEK (MISCELLANEOUS) ×2 IMPLANT
DRAPE UTILITY 15X26 TOWEL STRL (DRAPES) ×3 IMPLANT
FIBER LASER LITHO 273 (Laser) ×3 IMPLANT
GLOVE BIO SURGEON STRL SZ 6.5 (GLOVE) ×3 IMPLANT
GOWN STRL REUS W/ TWL LRG LVL3 (GOWN DISPOSABLE) ×4 IMPLANT
GOWN STRL REUS W/TWL LRG LVL3 (GOWN DISPOSABLE) ×2
GUIDEWIRE GREEN .038 145CM (MISCELLANEOUS) ×3 IMPLANT
INFUSOR MANOMETER BAG 3000ML (MISCELLANEOUS) ×3 IMPLANT
INTRODUCER DILATOR DOUBLE (INTRODUCER) IMPLANT
KIT TURNOVER CYSTO (KITS) ×3 IMPLANT
PACK CYSTO AR (MISCELLANEOUS) ×3 IMPLANT
SENSORWIRE 0.038 NOT ANGLED (WIRE) ×3
SET CYSTO W/LG BORE CLAMP LF (SET/KITS/TRAYS/PACK) ×3 IMPLANT
SHEATH URETERAL 12FRX35CM (MISCELLANEOUS) IMPLANT
SOL .9 NS 3000ML IRR  AL (IV SOLUTION) ×1
SOL .9 NS 3000ML IRR UROMATIC (IV SOLUTION) ×2 IMPLANT
STENT URET 6FRX24 CONTOUR (STENTS) ×3 IMPLANT
STENT URET 6FRX26 CONTOUR (STENTS) IMPLANT
SURGILUBE 2OZ TUBE FLIPTOP (MISCELLANEOUS) ×3 IMPLANT
WATER STERILE IRR 1000ML POUR (IV SOLUTION) ×3 IMPLANT
WIRE SENSOR 0.038 NOT ANGLED (WIRE) ×2 IMPLANT

## 2017-11-18 NOTE — Discharge Instructions (Signed)
You have a ureteral stent in place.  This is a tube that extends from your kidney to your bladder.  This may cause urinary bleeding, burning with urination, and urinary frequency.  Please call our office or present to the ED if you develop fevers >101 or pain which is not able to be controlled with oral pain medications.  You may be given either Flomax and/ or ditropan to help with bladder spasms and stent pain in addition to pain medications.   ° °[facility] °[address], [address] °[place], [place] [address] °[phone] ° ° ° °AMBULATORY SURGERY  °DISCHARGE INSTRUCTIONS ° ° °1) The drugs that you were given will stay in your system until tomorrow so for the next 24 hours you should not: ° °A) Drive an automobile °B) Make any legal decisions °C) Drink any alcoholic beverage ° ° °2) You may resume regular meals tomorrow.  Today it is better to start with liquids and gradually work up to solid foods. ° °You may eat anything you prefer, but it is better to start with liquids, then soup and crackers, and gradually work up to solid foods. ° ° °3) Please notify your doctor immediately if you have any unusual bleeding, trouble breathing, redness and pain at the surgery site, drainage, fever, or pain not relieved by medication. ° ° ° °4) Additional Instructions: ° ° ° ° ° ° ° °Please contact your physician with any problems or Same Day Surgery at [phone], Monday through Friday 6 am to 4 pm, or [place] [facility] [place] Main number at [phone]. °

## 2017-11-18 NOTE — Anesthesia Preprocedure Evaluation (Signed)
Anesthesia Evaluation  Patient identified by MRN, date of birth, ID band Patient awake    Reviewed: Allergy & Precautions, NPO status , Patient's Chart, lab work & pertinent test results  History of Anesthesia Complications Negative for: history of anesthetic complications  Airway Mallampati: III       Dental   Pulmonary neg COPD,           Cardiovascular hypertension, Pt. on medications (-) Past MI and (-) CHF (-) dysrhythmias (-) Valvular Problems/Murmurs     Neuro/Psych neg Seizures    GI/Hepatic Neg liver ROS, neg GERD  ,  Endo/Other  neg diabetes  Renal/GU      Musculoskeletal   Abdominal   Peds  Hematology   Anesthesia Other Findings   Reproductive/Obstetrics                             Anesthesia Physical Anesthesia Plan  ASA: II  Anesthesia Plan: General   Post-op Pain Management:    Induction: Intravenous  PONV Risk Score and Plan: 3 and Dexamethasone, Ondansetron and Midazolam  Airway Management Planned: Simple Face Mask and Oral ETT  Additional Equipment:   Intra-op Plan:   Post-operative Plan:   Informed Consent: I have reviewed the patients History and Physical, chart, labs and discussed the procedure including the risks, benefits and alternatives for the proposed anesthesia with the patient or authorized representative who has indicated his/her understanding and acceptance.     Plan Discussed with:   Anesthesia Plan Comments:         Anesthesia Quick Evaluation

## 2017-11-18 NOTE — Anesthesia Post-op Follow-up Note (Signed)
Anesthesia QCDR form completed.        

## 2017-11-18 NOTE — Op Note (Signed)
Date of procedure: 11/18/17  Preoperative diagnosis:  1. Right ureteral stone 2. Right hydroureteronephrosis  Postoperative diagnosis:  1. Same as above  Procedure: 1. Cystoscopy 2. Right ureteroscopy 3. Right retrograde pyelogram 4. Right ureteral stent placement 5. Basket extraction of stone fragment  Surgeon: Vanna ScotlandAshley Burak Zerbe, MD  Anesthesia: General  Complications: None  Intraoperative findings: 8 mm stone seen near the level of the iliacs, treated and all fragments removed.  Small nonobstructing stone in the right kidney also treated, 6 mm.  EBL: Minimal  Specimens: Stone fragment  Drains: 6 x 24 French double-J ureteral stent on right  Indication: Alicia Nelson is a 72 y.o. patient with with an 8 mm right proximal ureteral stone who was taken to shockwave lithotripsy but the stone was unable to be visualized, with concern for possible extrinsic compression from a large fibroid.  After reviewing the management options for treatment, she elected to proceed with the above surgical procedure(s). We have discussed the potential benefits and risks of the procedure, side effects of the proposed treatment, the likelihood of the patient achieving the goals of the procedure, and any potential problems that might occur during the procedure or recuperation. Informed consent has been obtained.  Description of procedure:  The patient was taken to the operating room and general anesthesia was induced.  The patient was placed in the dorsal lithotomy position, prepped and draped in the usual sterile fashion, and preoperative antibiotics were administered. A preoperative time-out was performed.   A 21 French scope was advanced per urethra into the bladder.  Attention was turned to the right ureteral orifice which was cannulated using a 5 [place]French open-ended ureteral catheter.  Gentle retrograde pyelogram was performed which revealed a filling defect near the level of the iliacs concerning for  stone at this level.  On previous IVP, there was concern for possible obstruction of the ureter at the level of the uterine fibroid, however today was quite clear on retrograde that the obstruction was more proximal to this at the level of the presumed stone.  Once the contrast had drained, the stone was clearly visible at this level as well on scout imaging.  Bowel gas was not an issue today on fluoroscopy.  There is still hydroureteronephrosis down to this level.  A sensor wire was then placed up to level of the kidney.  The proximal ureter was somewhat tortuous.  I advanced a 5 [place]French open-ended ureteral catheter up to level the proximal ureter and remove the sensor wire injecting more contrast in the collecting system which was relatively distended.  I then exchanged this for Super Stiff wire and snapped in place as a safety wire.  A 4.5 French semirigid ureteroscope was advanced up to level of the stone.  A 273 m laser fiber was then brought in and using settings of 0.8 J and 10 Hz, the stone was fragmented into smaller pieces.  The majority of these pieces were extracted using 1.9 French tipless nitinol basket.  Some of the fragments did reflux towards the renal pelvis and thus the scope was able to advance into the proximal ureter and these fragments were also removed.  Due to concern for retropulsion into the collecting system, second wire, a sensor wire was placed up to level of the kidney.  Semirigid scope was removed and a 8 French flexible digital ureteroscope was advanced over the Super Stiff wire up to the level of the renal pelvis.  Formal pyeloscopy was then performed visualizing each  and every calyx.  A 6 mm stone was identified within a mid lower pole calyx and was fragmented using dusting settings of 0.2 J and 40 Hz.  The scope was then backed down the length of the ureter.  A few small fragments were obliterated using the laser and one fragment was removed using the nitinol basket.  No residual  fragments remain.  I did perform a final retrograde pyelogram which showed a persistent transition at the level of the previous stone but no extravasation a 6 x 24 French double-J ureteral stent was advanced over the safety wire up to level the renal pelvis.  The wires partially drawn until focal stone within the renal pelvis.  The wires and fully withdrawn and a full coils noted within the bladder.  The bladder was then drained and few small fragments were evacuated from the bladder passed off the field as a specimen.  Patient was then clean and dry, repositioned in supine position, reversed by anesthesia, taken to PACU in stable condition.  Plan: Patient will follow-up in 2 weeks for cystoscopy, stent removal.  It does not appear that the fibroid itself is obstructing, rather the retained stone.  This was discussed with patient's daughter.  Vanna Scotland, M.D.

## 2017-11-18 NOTE — Transfer of Care (Signed)
Immediate Anesthesia Transfer of Care Note  Patient: Alicia Nelson  Procedure(s) Performed: CYSTOSCOPY WITH RETROGRADE PYELOGRAM (Right Ureter) CYSTOSCOPY/URETEROSCOPY/HOLMIUM LASER/STENT PLACEMENT (Right Ureter)  Patient Location: PACU  Anesthesia Type:General  Level of Consciousness: awake  Airway & Oxygen Therapy: Patient Spontanous Breathing  Post-op Assessment: Report given to RN  Post vital signs: stable  Last Vitals:  Vitals Value Taken Time  BP    Temp    Pulse    Resp 18 11/18/2017  1:58 PM  SpO2    Vitals shown include unvalidated device data.  Last Pain:  Vitals:   11/18/17 1221  TempSrc: Tympanic  PainSc: 0-No pain         Complications: No apparent anesthesia complications

## 2017-11-18 NOTE — Interval H&P Note (Signed)
History and Physical Interval Note:  11/18/2017 12:38 PM  Alicia Nelson  has presented today for surgery, with the diagnosis of right hydronephrosis  The various methods of treatment have been discussed with the patient and family. After consideration of risks, benefits and other options for treatment, the patient has consented to  Procedure(s): CYSTOSCOPY WITH URETEROSCOPY AND STENT PLACEMENT (Right) CYSTOSCOPY WITH RETROGRADE PYELOGRAM (Right) CYSTOSCOPY/URETEROSCOPY/HOLMIUM LASER/STENT PLACEMENT (Right) as a surgical intervention .  The patient's history has been reviewed, patient examined, no change in status, stable for surgery.  I have reviewed the patient's chart and labs.  Questions were answered to the patient's satisfaction.    RRR CTAB  Please see previous note for additional details and medical history/ meds.     Vanna ScotlandAshley Joel Mericle

## 2017-11-18 NOTE — Progress Notes (Signed)
Voided scant amt bloody

## 2017-11-18 NOTE — Anesthesia Postprocedure Evaluation (Signed)
Anesthesia Post Note  Patient: Alicia Nelson  Procedure(s) Performed: CYSTOSCOPY WITH RETROGRADE PYELOGRAM (Right Ureter) CYSTOSCOPY/URETEROSCOPY/HOLMIUM LASER/STENT PLACEMENT (Right Ureter)  Patient location during evaluation: PACU Anesthesia Type: General Level of consciousness: awake and alert Pain management: pain level controlled Vital Signs Assessment: post-procedure vital signs reviewed and stable Respiratory status: spontaneous breathing, nonlabored ventilation, respiratory function stable and patient connected to nasal cannula oxygen Cardiovascular status: blood pressure returned to baseline and stable Postop Assessment: no apparent nausea or vomiting Anesthetic complications: no     Last Vitals:  Vitals:   11/18/17 1429 11/18/17 1442  BP: 136/84 (!) 149/87  Pulse: 68 65  Resp: 12 10  Temp:  36.6 C  SpO2: 95% 97%    Last Pain:  Vitals:   11/18/17 1442  TempSrc:   PainSc: 3                  Cleda MccreedyJoseph K Emillee Talsma

## 2017-11-18 NOTE — Anesthesia Procedure Notes (Signed)
Procedure Name: Intubation Performed by: Marcy Siren, CRNA Pre-anesthesia Checklist: Patient identified, Emergency Drugs available, Suction available, Patient being monitored and Timeout performed Patient Re-evaluated:Patient Re-evaluated prior to induction Oxygen Delivery Method: Circle system utilized Preoxygenation: Pre-oxygenation with 100% oxygen Induction Type: IV induction Ventilation: Mask ventilation without difficulty Laryngoscope Size: Mac and 3 Grade View: Grade I Tube type: Oral Number of attempts: 1 Dental Injury: Teeth and Oropharynx as per pre-operative assessment

## 2017-11-19 ENCOUNTER — Encounter: Payer: Self-pay | Admitting: Urology

## 2017-11-19 NOTE — Addendum Note (Signed)
Addendum  created 11/19/17 1005 by Stormy Fabianurtis, Bernd Crom, CRNA   Charge Capture section accepted

## 2017-11-21 ENCOUNTER — Ambulatory Visit: Payer: Medicare HMO | Admitting: Urology

## 2017-11-25 LAB — STONE ANALYSIS
Ca Oxalate,Dihydrate: 80 %
Ca Oxalate,Monohydr.: 5 %
Ca phos cry stone ql IR: 15 %
Stone Weight KSTONE: 1.2 mg

## 2017-12-04 ENCOUNTER — Ambulatory Visit (INDEPENDENT_AMBULATORY_CARE_PROVIDER_SITE_OTHER): Payer: Medicare HMO | Admitting: Urology

## 2017-12-04 ENCOUNTER — Encounter: Payer: Self-pay | Admitting: Urology

## 2017-12-04 VITALS — BP 101/74 | HR 95 | Ht 62.0 in | Wt 130.0 lb

## 2017-12-04 DIAGNOSIS — N1330 Unspecified hydronephrosis: Secondary | ICD-10-CM | POA: Diagnosis not present

## 2017-12-04 DIAGNOSIS — N201 Calculus of ureter: Secondary | ICD-10-CM

## 2017-12-04 LAB — URINALYSIS, COMPLETE
Bilirubin, UA: NEGATIVE
Glucose, UA: NEGATIVE
Ketones, UA: NEGATIVE
Nitrite, UA: NEGATIVE
Specific Gravity, UA: 1.02 (ref 1.005–1.030)
Urobilinogen, Ur: 0.2 mg/dL (ref 0.2–1.0)
pH, UA: 7.5 (ref 5.0–7.5)

## 2017-12-04 LAB — MICROSCOPIC EXAMINATION: RBC, UA: >30 /hpf — ABNORMAL HIGH (ref 0–2)

## 2017-12-04 MED ORDER — LIDOCAINE HCL URETHRAL/MUCOSAL 2 % EX GEL
1.0000 "application " | Freq: Once | CUTANEOUS | Status: AC
  Administered 2017-12-04: 1 via URETHRAL

## 2017-12-04 MED ORDER — CIPROFLOXACIN HCL 500 MG PO TABS
500.0000 mg | ORAL_TABLET | Freq: Once | ORAL | Status: AC
  Administered 2017-12-04: 500 mg via ORAL

## 2017-12-04 NOTE — Progress Notes (Signed)
   12/04/17  CC:  Chief Complaint  Patient presents with  . Cysto Stent Removal    HPI: 72 year old for right ureteral stone status post right ureteroscopy on 11/18/2017.  She returns today for stent removal.  She is been doing well but does complain of urgency no gross hematuria, fevers or chills.  No dysuria.   Blood pressure 101/74, pulse 95, height 5\' 2"  (1.575 m), weight 130 lb (59 kg). NED. A&Ox3.   No respiratory distress   Abd soft, NT, ND Normal external genitalia with patent urethral meatus  Cystoscopy/ Stent removal procedure  Patient identification was confirmed, informed consent was obtained, and patient was prepped using Betadine solution.  Lidocaine jelly was administered per urethral meatus.    Preoperative abx where received prior to procedure.    Procedure: - Flexible cystoscope introduced, without any difficulty.   - Thorough search of the bladder revealed:    normal urethral meatus  Stent seen emanating from right ureteral orifice, grasped with stent graspers, and removed in entirety.    Post-Procedure: - Patient tolerated the procedure well   Assessment/ Plan:  1. Hydronephrosis of right kidney S/p ureteroscopy for obstructing stone Stent removed today Warning symptoms reviewed F/u in 4 weeks with RUS - Urinalysis, Complete - ciprofloxacin (CIPRO) tablet 500 mg - lidocaine (XYLOCAINE) 2 % jelly 1 application - US RENAL; Future  2. Right ureteral stone As above   Return in about 4 weeks (around 01/01/2018) for RUS.  Vanna Scotland, MD

## 2018-01-06 ENCOUNTER — Ambulatory Visit
Admission: RE | Admit: 2018-01-06 | Discharge: 2018-01-06 | Disposition: A | Discharge status: Home / Self Care | Payer: Medicare HMO | Source: Ambulatory Visit | Attending: Urology | Admitting: Urology

## 2018-01-06 DIAGNOSIS — N281 Cyst of kidney, acquired: Secondary | ICD-10-CM | POA: Diagnosis not present

## 2018-01-06 DIAGNOSIS — N1330 Unspecified hydronephrosis: Secondary | ICD-10-CM | POA: Diagnosis not present

## 2018-01-06 DIAGNOSIS — N1339 Other hydronephrosis: Secondary | ICD-10-CM | POA: Diagnosis not present

## 2018-01-08 ENCOUNTER — Ambulatory Visit: Payer: Medicare HMO | Admitting: Urology

## 2018-01-08 ENCOUNTER — Encounter: Payer: Self-pay | Admitting: Urology

## 2018-01-14 ENCOUNTER — Ambulatory Visit (INDEPENDENT_AMBULATORY_CARE_PROVIDER_SITE_OTHER): Payer: Medicare HMO | Admitting: Primary Care

## 2018-01-14 ENCOUNTER — Encounter: Payer: Self-pay | Admitting: Primary Care

## 2018-01-14 VITALS — BP 118/70 | HR 71 | Temp 98.1°F | Ht 62.0 in | Wt 124.8 lb

## 2018-01-14 DIAGNOSIS — Z87442 Personal history of urinary calculi: Secondary | ICD-10-CM

## 2018-01-14 DIAGNOSIS — Z800 Family history of malignant neoplasm of digestive organs: Secondary | ICD-10-CM

## 2018-01-14 DIAGNOSIS — I10 Essential (primary) hypertension: Secondary | ICD-10-CM | POA: Insufficient documentation

## 2018-01-14 LAB — COMPREHENSIVE METABOLIC PANEL
ALT: 12 U/L (ref 0–35)
AST: 14 U/L (ref 0–37)
Albumin: 4.3 g/dL (ref 3.5–5.2)
Alkaline Phosphatase: 76 U/L (ref 39–117)
BUN: 12 mg/dL (ref 6–23)
CO2: 31 mEq/L (ref 19–32)
Calcium: 10.5 mg/dL (ref 8.4–10.5)
Chloride: 104 mEq/L (ref 96–112)
Creatinine, Ser: 1.18 mg/dL (ref 0.40–1.20)
GFR: 47.57 mL/min — ABNORMAL LOW (ref >60.00)
Glucose, Bld: 101 mg/dL — ABNORMAL HIGH (ref 70–99)
Potassium: 4.1 mEq/L (ref 3.5–5.1)
Sodium: 141 mEq/L (ref 135–145)
Total Bilirubin: 0.5 mg/dL (ref 0.2–1.2)
Total Protein: 6.7 g/dL (ref 6.0–8.3)

## 2018-01-14 LAB — LIPID PANEL
Cholesterol: 226 mg/dL — ABNORMAL HIGH (ref 0–200)
HDL: 42.4 mg/dL (ref >39.00)
NonHDL: 183.24
Total CHOL/HDL Ratio: 5
Triglycerides: 393 mg/dL — ABNORMAL HIGH (ref 0.0–149.0)
VLDL: 78.6 mg/dL — ABNORMAL HIGH (ref 0.0–40.0)

## 2018-01-14 LAB — LDL CHOLESTEROL, DIRECT: Direct LDL: 145 mg/dL

## 2018-01-14 MED ORDER — LISINOPRIL-HYDROCHLOROTHIAZIDE 20-12.5 MG PO TABS: 1.0000 | ORAL_TABLET | Freq: Every day | ORAL | 3 refills | Status: DC

## 2018-01-14 NOTE — Assessment & Plan Note (Signed)
Diagnosed several months ago, symptoms resolved. Doing well s/p stent removal. Recent ultrasound reviewed and unremarkable.

## 2018-01-14 NOTE — Progress Notes (Signed)
Subjective:    Patient ID: Alicia Nelson, female    DOB: [birth year], 72 y.o.   MRN: [id number]  HPI  Alicia Nelson is a 72 year old female who presents today to establish care and discuss the problems mentioned below. Will obtain old records.  1) Essential Hypertension: Currently managed on lisinopril-HCTZ 20-12.5 mg daily. She is needing a refill of her medication today. She denies chest pain, dizziness.   BP Readings from Last 3 Encounters:  01/14/18 118/70  12/04/17 101/74  11/18/17 (!) 159/87   2) Renal Stones: Currently following with [facility], Dr. Apolinar Junes. History of right uretal stone with history of right ureteroscopy on 11/18/17. Stent removed on 12/04/17. Renal ultrasound completed on 01/06/18 which showed resolve of her previous right sided hydronephrosis.   3) Family History of Colon Cancer: In Father. Diagnosed in his 34's. She's never undergone colonoscopy and doesn't wish to pursue. She denies dizziness, unexplained weight loss, rectal bleeding.  Review of Systems  Constitutional: Negative for unexpected weight change.  Respiratory: Negative for shortness of breath.   Cardiovascular: Negative for chest pain.  Gastrointestinal: Negative for blood in stool.  Genitourinary: Negative for dysuria, flank pain and frequency.  Neurological: Negative for dizziness and headaches.       Past Medical History:  Diagnosis Date  . History of kidney stones   . Hypertension   . Polyp in nasopharynx      Social History   Socioeconomic History  . Marital status: Widowed    Spouse name: Not on file  . Number of children: Not on file  . Years of education: Not on file  . Highest education level: Not on file  Occupational History  . Not on file  Social Needs  . Financial resource strain: Not on file  . Food insecurity:    Worry: Not on file    Inability: Not on file  . Transportation needs:    Medical: Not on file    Non-medical: Not on file    Tobacco Use  . Smoking status: Never Smoker  . Smokeless tobacco: Never Used  Substance and Sexual Activity  . Alcohol use: Never    Frequency: Never  . Drug use: Never  . Sexual activity: Not on file  Lifestyle  . Physical activity:    Days per week: Not on file    Minutes per session: Not on file  . Stress: Not on file  Relationships  . Social connections:    Talks on phone: Not on file    Gets together: Not on file    Attends religious service: Not on file    Active member of club or organization: Not on file    Attends meetings of clubs or organizations: Not on file    Relationship status: Not on file  . Intimate partner violence:    Fear of current or ex partner: Not on file    Emotionally abused: Not on file    Physically abused: Not on file    Forced sexual activity: Not on file  Other Topics Concern  . Not on file  Social History Narrative   Widowed.   1 child. 2 grandchildren, 2 great grandchildren.   Retired.    Enjoys exercising at the gym.     Past Surgical History:  Procedure Laterality Date  . CYSTOSCOPY W/ RETROGRADES Right 11/18/2017   Procedure: CYSTOSCOPY WITH RETROGRADE PYELOGRAM;  Surgeon: Vanna Scotland, MD;  Location: [facility];  Service: Urology;  Laterality:  Right;  Marland Kitchen. CYSTOSCOPY/URETEROSCOPY/HOLMIUM LASER/STENT PLACEMENT Right 11/18/2017   Procedure: CYSTOSCOPY/URETEROSCOPY/HOLMIUM LASER/STENT PLACEMENT;  Surgeon: Vanna ScotlandBrandon, Ashley, MD;  Location: [facility];  Service: Urology;  Laterality: Right;  . EXTRACORPOREAL SHOCK WAVE LITHOTRIPSY Right 11/07/2017   Procedure: EXTRACORPOREAL SHOCK WAVE LITHOTRIPSY (ESWL);  Surgeon: Vanna ScotlandBrandon, Ashley, MD;  Location: [facility];  Service: Urology;  Laterality: Right;  . TUBAL LIGATION      Family History  Problem Relation Age of Onset  . Diabetes Father   . Colon cancer Father     No Known Allergies  Current Outpatient Medications on File Prior to Visit  Medication Sig Dispense Refill  . Acetaminophen  (TYLENOL ARTHRITIS PAIN PO) Take by mouth.    . calcium carbonate (OS-CAL - DOSED IN MG OF ELEMENTAL CALCIUM) 1250 (500 Ca) MG tablet Take 1 tablet by mouth 2 (two) times daily.    . Multiple Vitamin (MULTIVITAMIN WITH MINERALS) TABS tablet Take 1 tablet by mouth daily.    . vitamin C (ASCORBIC ACID) 500 MG tablet Take 1,000 mg by mouth 2 (two) times daily.     No current facility-administered medications on file prior to visit.     BP 118/70   Pulse 71   Temp 98.1 F (36.7 C) (Oral)   Ht 5\' 2"  (1.575 m)   Wt 124 lb 12 oz (56.6 kg)   SpO2 96%   BMI 22.82 kg/m    Objective:   Physical Exam  Constitutional: She appears well-nourished.  Neck: Neck supple.  Cardiovascular: Normal rate and regular rhythm.  Respiratory: Effort normal and breath sounds normal.  Skin: Skin is warm and dry.  Psychiatric: She has a normal mood and affect.           Assessment & Plan:

## 2018-01-14 NOTE — Assessment & Plan Note (Signed)
Has never undergone colonoscopy, declines today. No alarm signs.

## 2018-01-14 NOTE — Patient Instructions (Signed)
Stop by the lab prior to leaving today. I will notify you of your results once received.   I sent refills for lisinopril-hydrochlorothiazide to your pharmacy. Take 1 tablet by mouth once daily.  It was a pleasure to meet you today! Please don't hesitate to call or message me with any questions. Welcome to [facility]!

## 2018-01-14 NOTE — Assessment & Plan Note (Signed)
Stable in the office today, CMP reviewed from prior labs, repeat pending. Refills sent to pharmacy. Continue same.

## 2018-01-17 ENCOUNTER — Telehealth: Payer: Self-pay | Admitting: *Deleted

## 2018-01-17 NOTE — Telephone Encounter (Signed)
-----   Message from Doreene NestKatherine K Clark, NP sent at 01/16/2018  7:44 AM EST ----- Please notify patient:  Kidney function has declined when compared to labs in August. This may get worse with her current blood pressure medication, we need to switch her from lisinopril-hydrochlorothiazide to plain lisinopril. Please have her monitor her BP readings and set up a follow up visit with me in 3 weeks for BP check and kidney function check.  Let me know when you've spoken with her and I'll make the switch.

## 2018-01-17 NOTE — Telephone Encounter (Signed)
Message left for patient to return my call.  

## 2018-02-03 ENCOUNTER — Other Ambulatory Visit: Payer: Self-pay | Admitting: Primary Care

## 2018-02-03 DIAGNOSIS — I10 Essential (primary) hypertension: Secondary | ICD-10-CM

## 2018-02-03 MED ORDER — LISINOPRIL 20 MG PO TABS: 20.0000 mg | ORAL_TABLET | Freq: Every day | ORAL | 0 refills | Status: DC

## 2018-02-03 NOTE — Telephone Encounter (Signed)
Spoken and notified patient's daughter of Alicia Nelson's comments. Patient's daugher verbalized understanding.

## 2018-02-19 ENCOUNTER — Ambulatory Visit: Payer: Self-pay | Admitting: Family Medicine

## 2018-03-06 ENCOUNTER — Other Ambulatory Visit: Payer: Self-pay | Admitting: Primary Care

## 2018-03-06 ENCOUNTER — Ambulatory Visit (INDEPENDENT_AMBULATORY_CARE_PROVIDER_SITE_OTHER): Payer: Medicare HMO | Admitting: Primary Care

## 2018-03-06 ENCOUNTER — Encounter: Payer: Self-pay | Admitting: Primary Care

## 2018-03-06 VITALS — BP 124/86 | HR 64 | Temp 98.0°F | Ht 62.0 in | Wt 128.5 lb

## 2018-03-06 DIAGNOSIS — I10 Essential (primary) hypertension: Secondary | ICD-10-CM | POA: Diagnosis not present

## 2018-03-06 LAB — BASIC METABOLIC PANEL
BUN: 21 mg/dL (ref 6–23)
CO2: 30 mEq/L (ref 19–32)
Calcium: 9.4 mg/dL (ref 8.4–10.5)
Chloride: 103 mEq/L (ref 96–112)
Creatinine, Ser: 1.17 mg/dL (ref 0.40–1.20)
GFR: 48.02 mL/min — ABNORMAL LOW (ref >60.00)
Glucose, Bld: 91 mg/dL (ref 70–99)
Potassium: 4.1 mEq/L (ref 3.5–5.1)
Sodium: 140 mEq/L (ref 135–145)

## 2018-03-06 NOTE — Progress Notes (Signed)
Subjective:    Patient ID: Alicia Nelson, female    DOB: November 30, 1945, 73 y.o.   MRN: [id number]  HPI  Alicia Nelson is a 73 year old female who presents today for follow up of hypertension.  She was last evaluated on 01/14/18 as a new patient needing refills of her lisinopril-HCTZ 20-12.5 mg for which she had taken for years. During that day she underwent lab work which showed GFR of 47.57, this was a decrease from 53 2 months prior so she was switched to lisinopril 20 mg. It was advised that she return three weeks later for repeat renal function testing and BP check.  BP Readings from Last 3 Encounters:  03/06/18 124/86  01/14/18 118/70  12/04/17 101/74   She returns today requesting to be put back on lisinopril-HCTZ 20-12.5 mg. She endorses that she was taking HCTZ due to recurrent bladder infections, since being on HCTZ she hadn't had had any infections.  She's currently taking lisinopril 20 mg for two days, then lisinopril-HCTZ for one day, then back to lisinopril for two days, and so on. She does not check her BP at home. She's not taken any of her blood pressure medication this morning.   Review of Systems  Eyes: Negative for visual disturbance.  Respiratory: Negative for shortness of breath.   Cardiovascular: Negative for chest pain.  Neurological: Negative for dizziness and headaches.       Past Medical History:  Diagnosis Date  . History of kidney stones   . Hypertension   . Polyp in nasopharynx      Social History   Socioeconomic History  . Marital status: Widowed    Spouse name: Not on file  . Number of children: Not on file  . Years of education: Not on file  . Highest education level: Not on file  Occupational History  . Not on file  Social Needs  . Financial resource strain: Not on file  . Food insecurity:    Worry: Not on file    Inability: Not on file  . Transportation needs:    Medical: Not on file    Non-medical: Not on file  Tobacco Use  . Smoking  status: Never Smoker  . Smokeless tobacco: Never Used  Substance and Sexual Activity  . Alcohol use: Never    Frequency: Never  . Drug use: Never  . Sexual activity: Not on file  Lifestyle  . Physical activity:    Days per week: Not on file    Minutes per session: Not on file  . Stress: Not on file  Relationships  . Social connections:    Talks on phone: Not on file    Gets together: Not on file    Attends religious service: Not on file    Active member of club or organization: Not on file    Attends meetings of clubs or organizations: Not on file    Relationship status: Not on file  . Intimate partner violence:    Fear of current or ex partner: Not on file    Emotionally abused: Not on file    Physically abused: Not on file    Forced sexual activity: Not on file  Other Topics Concern  . Not on file  Social History Narrative   Widowed.   1 child. 2 grandchildren, 2 great grandchildren.   Retired.    Enjoys exercising at the gym.     Past Surgical History:  Procedure Laterality Date  . CYSTOSCOPY  W/ RETROGRADES Right 11/18/2017   Procedure: CYSTOSCOPY WITH RETROGRADE PYELOGRAM;  Surgeon: Vanna ScotlandBrandon, Ashley, MD;  Location: [facility];  Service: Urology;  Laterality: Right;  . CYSTOSCOPY/URETEROSCOPY/HOLMIUM LASER/STENT PLACEMENT Right 11/18/2017   Procedure: CYSTOSCOPY/URETEROSCOPY/HOLMIUM LASER/STENT PLACEMENT;  Surgeon: Vanna ScotlandBrandon, Ashley, MD;  Location: [facility];  Service: Urology;  Laterality: Right;  . EXTRACORPOREAL SHOCK WAVE LITHOTRIPSY Right 11/07/2017   Procedure: EXTRACORPOREAL SHOCK WAVE LITHOTRIPSY (ESWL);  Surgeon: Vanna ScotlandBrandon, Ashley, MD;  Location: [facility];  Service: Urology;  Laterality: Right;  . TUBAL LIGATION      Family History  Problem Relation Age of Onset  . Diabetes Father   . Colon cancer Father     No Known Allergies  Current Outpatient Medications on File Prior to Visit  Medication Sig Dispense Refill  . Acetaminophen (TYLENOL ARTHRITIS PAIN PO) Take  by mouth.    . calcium carbonate (OS-CAL - DOSED IN MG OF ELEMENTAL CALCIUM) 1250 (500 Ca) MG tablet Take 1 tablet by mouth 2 (two) times daily.    Marland Kitchen. lisinopril (PRINIVIL,ZESTRIL) 20 MG tablet Take 1 tablet (20 mg total) by mouth daily. For blood pressure. 90 tablet 0  . Multiple Vitamin (MULTIVITAMIN WITH MINERALS) TABS tablet Take 1 tablet by mouth daily.    . vitamin C (ASCORBIC ACID) 500 MG tablet Take 1,000 mg by mouth 2 (two) times daily.     No current facility-administered medications on file prior to visit.     BP 124/86   Pulse 64   Temp 98 F (36.7 C) (Oral)   Ht 5\' 2"  (1.575 m)   Wt 128 lb 8 oz (58.3 kg)   SpO2 98%   BMI 23.50 kg/m    Objective:   Physical Exam  Constitutional: She appears well-nourished.  Neck: Neck supple.  Cardiovascular: Normal rate and regular rhythm.  Respiratory: Effort normal and breath sounds normal.  Skin: Skin is warm and dry.           Assessment & Plan:

## 2018-03-06 NOTE — Patient Instructions (Addendum)
Stop by the lab prior to leaving today. I will notify you of your results once received.   The lisinopril-hydrochlorothiazide medication can cause kidney damage. I recommend you only take the lisinopril medication for blood pressure.  It was a pleasure to see you today!

## 2018-03-06 NOTE — Assessment & Plan Note (Signed)
Stable in the office today. Discussed the potential causes for decreased renal function with medications such as hydrochlorothiazide.  Discussed long-term effects of CKD.  Recommended that she stop taking her lisinopril-hydrochlorothiazide and solely take lisinopril. We will start with repeating her kidney function, she is still taking HCTZ twice weekly on average.

## 2018-04-06 DIAGNOSIS — H52209 Unspecified astigmatism, unspecified eye: Secondary | ICD-10-CM | POA: Diagnosis not present

## 2018-04-06 DIAGNOSIS — H5213 Myopia, bilateral: Secondary | ICD-10-CM | POA: Diagnosis not present

## 2018-04-10 ENCOUNTER — Other Ambulatory Visit (INDEPENDENT_AMBULATORY_CARE_PROVIDER_SITE_OTHER): Payer: Medicare HMO

## 2018-04-10 ENCOUNTER — Other Ambulatory Visit: Payer: Self-pay | Admitting: Primary Care

## 2018-04-10 DIAGNOSIS — I10 Essential (primary) hypertension: Secondary | ICD-10-CM | POA: Diagnosis not present

## 2018-04-10 DIAGNOSIS — N183 Chronic kidney disease, stage 3 unspecified: Secondary | ICD-10-CM | POA: Insufficient documentation

## 2018-04-10 LAB — BASIC METABOLIC PANEL
BUN: 16 mg/dL (ref 6–23)
CO2: 31 mEq/L (ref 19–32)
Calcium: 9.3 mg/dL (ref 8.4–10.5)
Chloride: 105 mEq/L (ref 96–112)
Creatinine, Ser: 1.1 mg/dL (ref 0.40–1.20)
GFR: 48.5 mL/min — ABNORMAL LOW (ref >60.00)
Glucose, Bld: 90 mg/dL (ref 70–99)
Potassium: 4.2 mEq/L (ref 3.5–5.1)
Sodium: 142 mEq/L (ref 135–145)

## 2018-04-10 NOTE — Assessment & Plan Note (Signed)
Noted on prior labs over the last several months. We have taken her off of HCTZ, continued lisinopril. Repeat renal function in 4 months.

## 2018-04-14 ENCOUNTER — Other Ambulatory Visit: Payer: Self-pay | Admitting: Primary Care

## 2018-04-14 DIAGNOSIS — I10 Essential (primary) hypertension: Secondary | ICD-10-CM

## 2018-04-14 MED ORDER — LISINOPRIL 20 MG PO TABS: 20.0000 mg | ORAL_TABLET | Freq: Every day | ORAL | 1 refills | Status: DC

## 2018-04-15 ENCOUNTER — Other Ambulatory Visit: Payer: Self-pay | Admitting: Primary Care

## 2018-04-15 DIAGNOSIS — I10 Essential (primary) hypertension: Secondary | ICD-10-CM

## 2018-04-18 ENCOUNTER — Other Ambulatory Visit: Payer: Self-pay | Admitting: Primary Care

## 2018-04-18 DIAGNOSIS — I10 Essential (primary) hypertension: Secondary | ICD-10-CM

## 2018-04-21 ENCOUNTER — Telehealth: Payer: Self-pay | Admitting: Primary Care

## 2018-04-21 NOTE — Telephone Encounter (Addendum)
Called [facility] and they confirm that they already filled Rx for patient. Not sure what happen there.   Notified patient that Rx is ready for her to pick up

## 2018-04-21 NOTE — Telephone Encounter (Signed)
Pt came into office and states that [facility] on [address] does not have her prescription Lisinopril. She said she has asked them several times and they state they don't have the refill from pcp. Please call patient concerning her prescription. CB [phone]

## 2018-08-15 ENCOUNTER — Telehealth: Payer: Self-pay

## 2018-08-15 NOTE — Telephone Encounter (Signed)
Left message to call clinic, needs COVID screen and back entrance lab information

## 2018-08-15 NOTE — Telephone Encounter (Signed)
Spoke w daughter, back entrance lab information given and COVID screen done

## 2018-08-18 ENCOUNTER — Other Ambulatory Visit (INDEPENDENT_AMBULATORY_CARE_PROVIDER_SITE_OTHER): Payer: Medicare HMO

## 2018-08-18 ENCOUNTER — Other Ambulatory Visit: Payer: Self-pay

## 2018-08-18 DIAGNOSIS — N183 Chronic kidney disease, stage 3 unspecified: Secondary | ICD-10-CM

## 2018-08-18 LAB — BASIC METABOLIC PANEL
BUN: 16 mg/dL (ref 6–23)
CO2: 31 mEq/L (ref 19–32)
Calcium: 9.2 mg/dL (ref 8.4–10.5)
Chloride: 103 mEq/L (ref 96–112)
Creatinine, Ser: 1.05 mg/dL (ref 0.40–1.20)
GFR: 51.13 mL/min — ABNORMAL LOW (ref >60.00)
Glucose, Bld: 93 mg/dL (ref 70–99)
Potassium: 4 mEq/L (ref 3.5–5.1)
Sodium: 140 mEq/L (ref 135–145)

## 2018-08-21 ENCOUNTER — Encounter: Payer: Self-pay | Admitting: *Deleted

## 2018-09-24 ENCOUNTER — Ambulatory Visit (INDEPENDENT_AMBULATORY_CARE_PROVIDER_SITE_OTHER): Payer: Medicare HMO | Admitting: Primary Care

## 2018-09-24 ENCOUNTER — Encounter: Payer: Self-pay | Admitting: Primary Care

## 2018-09-24 ENCOUNTER — Other Ambulatory Visit: Payer: Self-pay

## 2018-09-24 DIAGNOSIS — I10 Essential (primary) hypertension: Secondary | ICD-10-CM | POA: Diagnosis not present

## 2018-09-24 MED ORDER — LISINOPRIL 20 MG PO TABS: 20.0000 mg | ORAL_TABLET | Freq: Every day | ORAL | 0 refills | Status: DC

## 2018-09-24 NOTE — Assessment & Plan Note (Signed)
Above goal in the office today, has not had BP medication this morning. Unclear what her home readings are truly running as she's not writing down readings and is a poor historian overall.   Discussed to continue to check BP once daily for the next two weeks, write down readings. She will follow up in our office in two weeks for BP check. Continue lisinopril 20 mg daily for now, refill sent to pharmacy.

## 2018-09-24 NOTE — Progress Notes (Signed)
Subjective:    Patient ID: Alicia HamburgerLura J Nelson, female    DOB: 09/13/1945, 73 y.o.   MRN: [id number]  HPI  Alicia Nelson is a 73 year old female with a history of hypertension, CKD Stage III who presents today for follow up of hypertension.  She is currently managed on lisinopril 20 mg for which is compliant to daily. She has not had her morning dose today. She's been checking her BP at home over the last one week with her wrist cuff which is running "high" with some "good readings" over the last three days. She can't remember her readings specifically but did notice one reading last week which was 155 systolic. Over the last three days her readings have been 120's/80's.   She denies dizziness, chest pain, shortness of breath. She received a different pill shape and color of her most recent lisinopril refill and believes this is causing the changes in BP levels.   BP Readings from Last 3 Encounters:  09/24/18 (!) 144/90  03/06/18 124/86  01/14/18 118/70     Review of Systems  Eyes: Negative for visual disturbance.  Respiratory: Negative for shortness of breath.   Cardiovascular: Negative for chest pain.  Neurological: Negative for dizziness and headaches.       Past Medical History:  Diagnosis Date  . History of kidney stones   . Hypertension   . Polyp in nasopharynx      Social History   Socioeconomic History  . Marital status: Widowed    Spouse name: Not on file  . Number of children: Not on file  . Years of education: Not on file  . Highest education level: Not on file  Occupational History  . Not on file  Social Needs  . Financial resource strain: Not on file  . Food insecurity    Worry: Not on file    Inability: Not on file  . Transportation needs    Medical: Not on file    Non-medical: Not on file  Tobacco Use  . Smoking status: Never Smoker  . Smokeless tobacco: Never Used  Substance and Sexual Activity  . Alcohol use: Never    Frequency: Never  . Drug use:  Never  . Sexual activity: Not on file  Lifestyle  . Physical activity    Days per week: Not on file    Minutes per session: Not on file  . Stress: Not on file  Relationships  . Social Musicianconnections    Talks on phone: Not on file    Gets together: Not on file    Attends religious service: Not on file    Active member of club or organization: Not on file    Attends meetings of clubs or organizations: Not on file    Relationship status: Not on file  . Intimate partner violence    Fear of current or ex partner: Not on file    Emotionally abused: Not on file    Physically abused: Not on file    Forced sexual activity: Not on file  Other Topics Concern  . Not on file  Social History Narrative   Widowed.   1 child. 2 grandchildren, 2 great grandchildren.   Retired.    Enjoys exercising at the gym.     Past Surgical History:  Procedure Laterality Date  . CYSTOSCOPY W/ RETROGRADES Right 11/18/2017   Procedure: CYSTOSCOPY WITH RETROGRADE PYELOGRAM;  Surgeon: Vanna ScotlandBrandon, Ashley, MD;  Location: [facility];  Service: Urology;  Laterality: Right;  .  CYSTOSCOPY/URETEROSCOPY/HOLMIUM LASER/STENT PLACEMENT Right 11/18/2017   Procedure: CYSTOSCOPY/URETEROSCOPY/HOLMIUM LASER/STENT PLACEMENT;  Surgeon: Hollice Espy, MD;  Location: [facility];  Service: Urology;  Laterality: Right;  . EXTRACORPOREAL SHOCK WAVE LITHOTRIPSY Right 11/07/2017   Procedure: EXTRACORPOREAL SHOCK WAVE LITHOTRIPSY (ESWL);  Surgeon: Hollice Espy, MD;  Location: [facility];  Service: Urology;  Laterality: Right;  . TUBAL LIGATION      Family History  Problem Relation Age of Onset  . Diabetes Father   . Colon cancer Father     No Known Allergies  Current Outpatient Medications on File Prior to Visit  Medication Sig Dispense Refill  . Acetaminophen (TYLENOL ARTHRITIS PAIN PO) Take by mouth.    . calcium carbonate (OS-CAL - DOSED IN MG OF ELEMENTAL CALCIUM) 1250 (500 Ca) MG tablet Take 1 tablet by mouth 2 (two) times daily.     . Multiple Vitamin (MULTIVITAMIN WITH MINERALS) TABS tablet Take 1 tablet by mouth daily.    . vitamin C (ASCORBIC ACID) 500 MG tablet Take 1,000 mg by mouth 2 (two) times daily.     No current facility-administered medications on file prior to visit.     BP (!) 144/90   Pulse 64   Temp 98.5 F (36.9 C) (Temporal)   Ht 5\' 2"  (1.575 m)   Wt 131 lb 8 oz (59.6 kg)   SpO2 97%   BMI 24.05 kg/m    Objective:   Physical Exam  Constitutional: She appears well-nourished.  Neck: Neck supple.  Cardiovascular: Normal rate and regular rhythm.  Respiratory: Effort normal and breath sounds normal.  Skin: Skin is warm and dry.  Psychiatric: She has a normal mood and affect.           Assessment & Plan:

## 2018-09-24 NOTE — Patient Instructions (Signed)
Continue lisinopril 20 mg tablets once daily for blood pressure.  Check your blood pressure at home once daily for the next two weeks, write down your readings.  Schedule a follow up visit with me for two weeks from today for blood pressure check.  It was a pleasure to see you today!

## 2018-10-08 ENCOUNTER — Ambulatory Visit: Payer: Medicare HMO | Admitting: Primary Care

## 2018-10-08 ENCOUNTER — Other Ambulatory Visit: Payer: Self-pay

## 2018-10-08 ENCOUNTER — Encounter: Payer: Self-pay | Admitting: Primary Care

## 2018-10-08 ENCOUNTER — Ambulatory Visit (INDEPENDENT_AMBULATORY_CARE_PROVIDER_SITE_OTHER): Payer: Medicare HMO | Admitting: Primary Care

## 2018-10-08 DIAGNOSIS — E785 Hyperlipidemia, unspecified: Secondary | ICD-10-CM

## 2018-10-08 DIAGNOSIS — I10 Essential (primary) hypertension: Secondary | ICD-10-CM | POA: Diagnosis not present

## 2018-10-08 DIAGNOSIS — N183 Chronic kidney disease, stage 3 unspecified: Secondary | ICD-10-CM

## 2018-10-08 LAB — LIPID PANEL
Cholesterol: 208 mg/dL — ABNORMAL HIGH (ref 0–200)
HDL: 40.3 mg/dL (ref >39.00)
NonHDL: 167.23
Total CHOL/HDL Ratio: 5
Triglycerides: 257 mg/dL — ABNORMAL HIGH (ref 0.0–149.0)
VLDL: 51.4 mg/dL — ABNORMAL HIGH (ref 0.0–40.0)

## 2018-10-08 LAB — COMPREHENSIVE METABOLIC PANEL
ALT: 13 U/L (ref 0–35)
AST: 15 U/L (ref 0–37)
Albumin: 4.1 g/dL (ref 3.5–5.2)
Alkaline Phosphatase: 61 U/L (ref 39–117)
BUN: 20 mg/dL (ref 6–23)
CO2: 30 mEq/L (ref 19–32)
Calcium: 9.2 mg/dL (ref 8.4–10.5)
Chloride: 106 mEq/L (ref 96–112)
Creatinine, Ser: 1.07 mg/dL (ref 0.40–1.20)
GFR: 50.01 mL/min — ABNORMAL LOW (ref >60.00)
Glucose, Bld: 93 mg/dL (ref 70–99)
Potassium: 4 mEq/L (ref 3.5–5.1)
Sodium: 142 mEq/L (ref 135–145)
Total Bilirubin: 0.6 mg/dL (ref 0.2–1.2)
Total Protein: 6.3 g/dL (ref 6.0–8.3)

## 2018-10-08 LAB — LDL CHOLESTEROL, DIRECT: Direct LDL: 131 mg/dL

## 2018-10-08 NOTE — Progress Notes (Signed)
Subjective:    Patient ID: Alicia Nelson, female    DOB: 1946/02/03, 73 y.o.   MRN: [id number]  HPI  Alicia Nelson is a 73 year old female who presents today for follow up of hypertension.  She was last evaluated on 09/24/18, noted to have high blood pressure, she endorsed not taking her medication that morning. She couldn't remember her BP readings from home so we asked for her to start monitoring daily and follow up today.  BP Readings from Last 3 Encounters:  10/08/18 136/72  09/24/18 (!) 144/90  03/06/18 124/86   Since her last visit she's been checking her BP at home which is running 110's/70's-80's. She denies dizziness, chest pain, headaches.   Review of Systems  Eyes: Negative for visual disturbance.  Respiratory: Negative for shortness of breath.   Cardiovascular: Negative for chest pain.  Neurological: Negative for dizziness and headaches.       Past Medical History:  Diagnosis Date  . History of kidney stones   . Hypertension   . Polyp in nasopharynx      Social History   Socioeconomic History  . Marital status: Widowed    Spouse name: Not on file  . Number of children: Not on file  . Years of education: Not on file  . Highest education level: Not on file  Occupational History  . Not on file  Social Needs  . Financial resource strain: Not on file  . Food insecurity    Worry: Not on file    Inability: Not on file  . Transportation needs    Medical: Not on file    Non-medical: Not on file  Tobacco Use  . Smoking status: Never Smoker  . Smokeless tobacco: Never Used  Substance and Sexual Activity  . Alcohol use: Never    Frequency: Never  . Drug use: Never  . Sexual activity: Not on file  Lifestyle  . Physical activity    Days per week: Not on file    Minutes per session: Not on file  . Stress: Not on file  Relationships  . Social Herbalist on phone: Not on file    Gets together: Not on file    Attends religious service: Not on file     Active member of club or organization: Not on file    Attends meetings of clubs or organizations: Not on file    Relationship status: Not on file  . Intimate partner violence    Fear of current or ex partner: Not on file    Emotionally abused: Not on file    Physically abused: Not on file    Forced sexual activity: Not on file  Other Topics Concern  . Not on file  Social History Narrative   Widowed.   1 child. 2 grandchildren, 2 great grandchildren.   Retired.    Enjoys exercising at the gym.     Past Surgical History:  Procedure Laterality Date  . CYSTOSCOPY W/ RETROGRADES Right 11/18/2017   Procedure: CYSTOSCOPY WITH RETROGRADE PYELOGRAM;  Surgeon: Hollice Espy, MD;  Location: [facility];  Service: Urology;  Laterality: Right;  . CYSTOSCOPY/URETEROSCOPY/HOLMIUM LASER/STENT PLACEMENT Right 11/18/2017   Procedure: CYSTOSCOPY/URETEROSCOPY/HOLMIUM LASER/STENT PLACEMENT;  Surgeon: Hollice Espy, MD;  Location: [facility];  Service: Urology;  Laterality: Right;  . EXTRACORPOREAL SHOCK WAVE LITHOTRIPSY Right 11/07/2017   Procedure: EXTRACORPOREAL SHOCK WAVE LITHOTRIPSY (ESWL);  Surgeon: Hollice Espy, MD;  Location: [facility];  Service: Urology;  Laterality: Right;  .  TUBAL LIGATION      Family History  Problem Relation Age of Onset  . Diabetes Father   . Colon cancer Father     No Known Allergies  Current Outpatient Medications on File Prior to Visit  Medication Sig Dispense Refill  . Acetaminophen (TYLENOL ARTHRITIS PAIN PO) Take by mouth.    . calcium carbonate (OS-CAL - DOSED IN MG OF ELEMENTAL CALCIUM) 1250 (500 Ca) MG tablet Take 1 tablet by mouth 2 (two) times daily.    Marland Kitchen. lisinopril (ZESTRIL) 20 MG tablet Take 1 tablet (20 mg total) by mouth daily. For blood pressure. Dx code:  I10 90 tablet 0  . Multiple Vitamin (MULTIVITAMIN WITH MINERALS) TABS tablet Take 1 tablet by mouth daily.    . vitamin C (ASCORBIC ACID) 500 MG tablet Take 1,000 mg by mouth 2 (two) times daily.      No current facility-administered medications on file prior to visit.     BP 136/72   Pulse 82   Temp 98.2 F (36.8 C) (Temporal)   Ht 5\' 2"  (1.575 m)   Wt 131 lb 8 oz (59.6 kg)   SpO2 97%   BMI 24.05 kg/m    Objective:   Physical Exam  Constitutional: She appears well-nourished.  Neck: Neck supple.  Cardiovascular: Normal rate and regular rhythm.  Respiratory: Effort normal and breath sounds normal.  Skin: Skin is warm and dry.           Assessment & Plan:

## 2018-10-08 NOTE — Assessment & Plan Note (Signed)
Improved in the office today, home readings are even lower. Continue lisinopril 20 mg daily. Repeat renal function pending.

## 2018-10-08 NOTE — Assessment & Plan Note (Signed)
Above goal on prior labs, repeat lipids pending today.

## 2018-10-08 NOTE — Assessment & Plan Note (Signed)
BMP from June 2020 stable, repeat renal function pending. Continue ACE for renal protection.

## 2018-10-08 NOTE — Patient Instructions (Signed)
Stop by the lab prior to leaving today. I will notify you of your results once received.   Continue taking lisinopril 20 mg once daily for blood pressure.  It was a pleasure to see you today!

## 2018-12-03 ENCOUNTER — Telehealth: Payer: Self-pay | Admitting: Primary Care

## 2018-12-03 DIAGNOSIS — I10 Essential (primary) hypertension: Secondary | ICD-10-CM

## 2018-12-03 MED ORDER — LISINOPRIL 20 MG PO TABS: 20.0000 mg | ORAL_TABLET | Freq: Every day | ORAL | 1 refills | Status: DC

## 2018-12-03 NOTE — Telephone Encounter (Signed)
Spoken to patient and notified her Rx have been sent as requested.

## 2018-12-03 NOTE — Telephone Encounter (Signed)
Message left for patient to return my call.  

## 2018-12-03 NOTE — Telephone Encounter (Signed)
Pt came into office needing a refill on lisinopril she said she don't hear well and that she didn't know to call pharmacy first. She has about a week left and would like this sent in. Please let patient know this has been sent to [facility] on [address]  Meriel Flavors [phone]

## 2019-05-01 ENCOUNTER — Ambulatory Visit: Payer: Medicare HMO | Attending: Internal Medicine

## 2019-05-01 DIAGNOSIS — Z23 Encounter for immunization: Secondary | ICD-10-CM | POA: Insufficient documentation

## 2019-05-01 NOTE — Progress Notes (Signed)
   Covid-19 Vaccination Clinic  Name:  Alicia Nelson    MRN: [id number] DOB: 01-19-1946  05/01/2019  Alicia Nelson was observed post Covid-19 immunization for 15 minutes without incidence. She was provided with Vaccine Information Sheet and instruction to access the V-Safe system.   Alicia Nelson was instructed to call 911 with any severe reactions post vaccine: Marland Kitchen Difficulty breathing  . Swelling of your face and throat  . A fast heartbeat  . A bad rash all over your body  . Dizziness and weakness    Immunizations Administered    Name Date Dose VIS Date Route   Pfizer COVID-19 Vaccine 05/01/2019  1:51 PM 0.3 mL 02/13/2019 Intramuscular   Manufacturer: [employer], [employer]   Lot: [id number]   NDC: [address]-8

## 2019-05-15 ENCOUNTER — Other Ambulatory Visit: Payer: Self-pay | Admitting: Primary Care

## 2019-05-15 DIAGNOSIS — I10 Essential (primary) hypertension: Secondary | ICD-10-CM

## 2019-05-27 ENCOUNTER — Ambulatory Visit: Payer: Medicare HMO | Attending: Internal Medicine

## 2019-05-27 DIAGNOSIS — Z23 Encounter for immunization: Secondary | ICD-10-CM

## 2019-05-27 NOTE — Progress Notes (Signed)
   Covid-19 Vaccination Clinic  Name:  [name]    MRN: [id number] DOB: June 11, 1945  05/27/2019  [name] was observed post Covid-19 immunization for 15 minutes without incident. She was provided with Vaccine Information Sheet and instruction to access the V-Safe system.   [name] was instructed to call 911 with any severe reactions post vaccine: Marland Kitchen Difficulty breathing  . Swelling of face and throat  . A fast heartbeat  . A bad rash all over body  . Dizziness and weakness   Immunizations Administered    Name Date Dose VIS Date Route   Pfizer COVID-19 Vaccine 05/27/2019 11:46 AM 0.3 mL 02/13/2019 Intramuscular   Manufacturer: [employer], [employer]   Lot: [id number]   NDC: [address]-6

## 2019-11-17 ENCOUNTER — Other Ambulatory Visit: Payer: Self-pay | Admitting: Primary Care

## 2019-11-17 DIAGNOSIS — I10 Essential (primary) hypertension: Secondary | ICD-10-CM

## 2019-11-24 ENCOUNTER — Other Ambulatory Visit: Payer: Self-pay | Admitting: Primary Care

## 2019-11-24 DIAGNOSIS — I10 Essential (primary) hypertension: Secondary | ICD-10-CM

## 2019-11-27 NOTE — Telephone Encounter (Signed)
Called l/m to call office must have f/u app for refills.

## 2019-11-30 NOTE — Telephone Encounter (Signed)
Left message to return call to our office.  

## 2019-12-16 ENCOUNTER — Encounter: Payer: Self-pay | Admitting: Primary Care

## 2019-12-16 ENCOUNTER — Ambulatory Visit (INDEPENDENT_AMBULATORY_CARE_PROVIDER_SITE_OTHER): Payer: Medicare HMO | Admitting: Primary Care

## 2019-12-16 ENCOUNTER — Other Ambulatory Visit: Payer: Self-pay

## 2019-12-16 ENCOUNTER — Ambulatory Visit (INDEPENDENT_AMBULATORY_CARE_PROVIDER_SITE_OTHER)
Admission: RE | Admit: 2019-12-16 | Discharge: 2019-12-16 | Disposition: A | Discharge status: Home / Self Care | Payer: Medicare HMO | Source: Ambulatory Visit | Attending: Primary Care | Admitting: Primary Care

## 2019-12-16 VITALS — BP 118/84 | HR 82 | Temp 97.6°F | Ht 62.0 in | Wt 125.0 lb

## 2019-12-16 DIAGNOSIS — M546 Pain in thoracic spine: Secondary | ICD-10-CM

## 2019-12-16 DIAGNOSIS — Z1159 Encounter for screening for other viral diseases: Secondary | ICD-10-CM | POA: Diagnosis not present

## 2019-12-16 DIAGNOSIS — G8929 Other chronic pain: Secondary | ICD-10-CM

## 2019-12-16 DIAGNOSIS — N183 Chronic kidney disease, stage 3 unspecified: Secondary | ICD-10-CM

## 2019-12-16 DIAGNOSIS — E785 Hyperlipidemia, unspecified: Secondary | ICD-10-CM

## 2019-12-16 DIAGNOSIS — I10 Essential (primary) hypertension: Secondary | ICD-10-CM | POA: Diagnosis not present

## 2019-12-16 LAB — COMPREHENSIVE METABOLIC PANEL
ALT: 13 U/L (ref 0–35)
AST: 15 U/L (ref 0–37)
Albumin: 4.2 g/dL (ref 3.5–5.2)
Alkaline Phosphatase: 55 U/L (ref 39–117)
BUN: 24 mg/dL — ABNORMAL HIGH (ref 6–23)
CO2: 31 mEq/L (ref 19–32)
Calcium: 10 mg/dL (ref 8.4–10.5)
Chloride: 102 mEq/L (ref 96–112)
Creatinine, Ser: 1.19 mg/dL (ref 0.40–1.20)
GFR: 44.29 mL/min — ABNORMAL LOW (ref >60.00)
Glucose, Bld: 95 mg/dL (ref 70–99)
Potassium: 4.1 mEq/L (ref 3.5–5.1)
Sodium: 141 mEq/L (ref 135–145)
Total Bilirubin: 0.9 mg/dL (ref 0.2–1.2)
Total Protein: 6.2 g/dL (ref 6.0–8.3)

## 2019-12-16 LAB — LDL CHOLESTEROL, DIRECT: Direct LDL: 155 mg/dL

## 2019-12-16 LAB — CBC
HCT: 42.3 % (ref 36.0–46.0)
Hemoglobin: 14.6 g/dL (ref 12.0–15.0)
MCHC: 34.4 g/dL (ref 30.0–36.0)
MCV: 93.3 fl (ref 78.0–100.0)
Platelets: 165 10*3/uL (ref 150.0–400.0)
RBC: 4.54 Mil/uL (ref 3.87–5.11)
RDW: 12.9 % (ref 11.5–15.5)
WBC: 4.8 10*3/uL (ref 4.0–10.5)

## 2019-12-16 LAB — LIPID PANEL
Cholesterol: 234 mg/dL — ABNORMAL HIGH (ref 0–200)
HDL: 43.3 mg/dL (ref >39.00)
NonHDL: 190.74
Total CHOL/HDL Ratio: 5
Triglycerides: 238 mg/dL — ABNORMAL HIGH (ref 0.0–149.0)
VLDL: 47.6 mg/dL — ABNORMAL HIGH (ref 0.0–40.0)

## 2019-12-16 NOTE — Patient Instructions (Addendum)
You will get an Xray today of your spine due to your back pain and we will be in touch with these results.   Continue to take your lisinopril for your blood pressure   Continue to monitor your blood pressure at home     Hypertension, Adult Hypertension is another name for high blood pressure. High blood pressure forces your heart to work harder to pump blood. This can cause problems over time. There are two numbers in a blood pressure reading. There is a top number (systolic) over a bottom number (diastolic). It is best to have a blood pressure that is below 120/80. Healthy choices can help lower your blood pressure, or you may need medicine to help lower it. What are the causes? The cause of this condition is not known. Some conditions may be related to high blood pressure. What increases the risk?   Smoking.  Having type 2 diabetes mellitus, high cholesterol, or both.  Not getting enough exercise or physical activity.  Being overweight.  Having too much fat, sugar, calories, or salt (sodium) in your diet.  Drinking too much alcohol.  Having long-term (chronic) kidney disease.  Having a family history of high blood pressure.  Age. Risk increases with age.  Race. You may be at higher risk if you are African American.  Gender. Men are at higher risk than women before age 56. After age 7, women are at higher risk than men.  Having obstructive sleep apnea.  Stress. What are the signs or symptoms?  High blood pressure may not cause symptoms. Very high blood pressure (hypertensive crisis) may cause: ? Headache. ? Feelings of worry or nervousness (anxiety). ? Shortness of breath. ? Nosebleed. ? A feeling of being sick to your stomach (nausea). ? Throwing up (vomiting). ? Changes in how you see. ? Very bad chest pain. ? Seizures. How is this treated?  This condition is treated by making healthy lifestyle changes, such as: ? Eating healthy foods. ? Exercising  more. ? Drinking less alcohol.  Your health care provider may prescribe medicine if lifestyle changes are not enough to get your blood pressure under control, and if: ? Your top number is above 130. ? Your bottom number is above 80.  Your personal target blood pressure may vary. Follow these instructions at home: Eating and drinking   If told, follow the DASH eating plan. To follow this plan: ? Fill one half of your plate at each meal with fruits and vegetables. ? Fill one fourth of your plate at each meal with whole grains. Whole grains include whole-wheat pasta, brown rice, and whole-grain bread. ? Eat or drink low-fat dairy products, such as skim milk or low-fat yogurt. ? Fill one fourth of your plate at each meal with low-fat (lean) proteins. Low-fat proteins include fish, chicken without skin, eggs, beans, and tofu. ? Avoid fatty meat, cured and processed meat, or chicken with skin. ? Avoid pre-made or processed food.  Eat less than 1,500 mg of salt each day.  Do not drink alcohol if: ? Your doctor tells you not to drink. ? You are pregnant, may be pregnant, or are planning to become pregnant.  If you drink alcohol: ? Limit how much you use to:  0-1 drink a day for women.  0-2 drinks a day for men. ? Be aware of how much alcohol is in your drink. In the [place] one drink equals one 12 oz bottle of beer (355 mL), one 5 oz glass of  wine (148 mL), or one 1 oz glass of hard liquor (44 mL). Lifestyle   Work with your doctor to stay at a healthy weight or to lose weight. Ask your doctor what the best weight is for you.  Get at least 30 minutes of exercise most days of the week. This may include walking, swimming, or biking.  Get at least 30 minutes of exercise that strengthens your muscles (resistance exercise) at least 3 days a week. This may include lifting weights or doing Pilates.  Do not use any products that contain nicotine or tobacco, such as cigarettes, e-cigarettes,  and chewing tobacco. If you need help quitting, ask your doctor.  Check your blood pressure at home as told by your doctor.  Keep all follow-up visits as told by your doctor. This is important. Medicines  Take over-the-counter and prescription medicines only as told by your doctor. Follow directions carefully.  Do not skip doses of blood pressure medicine. The medicine does not work as well if you skip doses. Skipping doses also puts you at risk for problems.  Ask your doctor about side effects or reactions to medicines that you should watch for. Contact a doctor if you:  Think you are having a reaction to the medicine you are taking.  Have headaches that keep coming back (recurring).  Feel dizzy.  Have swelling in your ankles.  Have trouble with your vision. Get help right away if you:  Get a very bad headache.  Start to feel mixed up (confused).  Feel weak or numb.  Feel faint.  Have very bad pain in your: ? Chest. ? Belly (abdomen).  Throw up more than once.  Have trouble breathing. Summary  Hypertension is another name for high blood pressure.  High blood pressure forces your heart to work harder to pump blood.  For most people, a normal blood pressure is less than 120/80.  Making healthy choices can help lower blood pressure. If your blood pressure does not get lower with healthy choices, you may need to take medicine. This information is not intended to replace advice given to you by your health care provider. Make sure you discuss any questions you have with your health care provider. Document Revised: 10/30/2017 Document Reviewed: 10/30/2017 Elsevier Patient Education  2020 [employer] Inc.  Acute Back Pain, Adult Acute back pain is sudden and usually short-lived. It is often caused by an injury to the muscles and tissues in the back. The injury may result from:  A muscle or ligament getting overstretched or torn (strained). Ligaments are tissues that  connect bones to each other. Lifting something improperly can cause a back strain.  Wear and tear (degeneration) of the spinal disks. Spinal disks are circular tissue that provides cushioning between the bones of the spine (vertebrae).  Twisting motions, such as while playing sports or doing yard work.  A hit to the back.  Arthritis. You may have a physical exam, lab tests, and imaging tests to find the cause of your pain. Acute back pain usually goes away with rest and home care. Follow these instructions at home: Managing pain, stiffness, and swelling  Take over-the-counter and prescription medicines only as told by your health care provider.  Your health care provider may recommend applying ice during the first 24-48 hours after your pain starts. To do this: ? Put ice in a plastic bag. ? Place a towel between your skin and the bag. ? Leave the ice on for 20 minutes, 2-3 times  a day.  If directed, apply heat to the affected area as often as told by your health care provider. Use the heat source that your health care provider recommends, such as a moist heat pack or a heating pad. ? Place a towel between your skin and the heat source. ? Leave the heat on for 20-30 minutes. ? Remove the heat if your skin turns bright red. This is especially important if you are unable to feel pain, heat, or cold. You have a greater risk of getting burned. Activity   Do not stay in bed. Staying in bed for more than 1-2 days can delay your recovery.  Sit up and stand up straight. Avoid leaning forward when you sit, or hunching over when you stand. ? If you work at a desk, sit close to it so you do not need to lean over. Keep your chin tucked in. Keep your neck drawn back, and keep your elbows bent at a right angle. Your arms should look like the letter "L." ? Sit high and close to the steering wheel when you drive. Add lower back (lumbar) support to your car seat, if needed.  Take short walks on even  surfaces as soon as you are able. Try to increase the length of time you walk each day.  Do not sit, drive, or stand in one place for more than 30 minutes at a time. Sitting or standing for long periods of time can put stress on your back.  Do not drive or use heavy machinery while taking prescription pain medicine.  Use proper lifting techniques. When you bend and lift, use positions that put less stress on your back: ? [place] your knees. ? Keep the load close to your body. ? Avoid twisting.  Exercise regularly as told by your health care provider. Exercising helps your back heal faster and helps prevent back injuries by keeping muscles strong and flexible.  Work with a physical therapist to make a safe exercise program, as recommended by your health care provider. Do any exercises as told by your physical therapist. Lifestyle  Maintain a healthy weight. Extra weight puts stress on your back and makes it difficult to have good posture.  Avoid activities or situations that make you feel anxious or stressed. Stress and anxiety increase muscle tension and can make back pain worse. Learn ways to manage anxiety and stress, such as through exercise. General instructions  Sleep on a firm mattress in a comfortable position. Try lying on your side with your knees slightly bent. If you lie on your back, put a pillow under your knees.  Follow your treatment plan as told by your health care provider. This may include: ? Cognitive or behavioral therapy. ? Acupuncture or massage therapy. ? Meditation or yoga. Contact a health care provider if:  You have pain that is not relieved with rest or medicine.  You have increasing pain going down into your legs or buttocks.  Your pain does not improve after 2 weeks.  You have pain at night.  You lose weight without trying.  You have a fever or chills. Get help right away if:  You develop new bowel or bladder control problems.  You have unusual  weakness or numbness in your arms or legs.  You develop nausea or vomiting.  You develop abdominal pain.  You feel faint. Summary  Acute back pain is sudden and usually short-lived.  Use proper lifting techniques. When you bend and lift, use positions that  put less stress on your back.  Take over-the-counter and prescription medicines and apply heat or ice as directed by your health care provider. This information is not intended to replace advice given to you by your health care provider. Make sure you discuss any questions you have with your health care provider. Document Revised: 06/10/2018 Document Reviewed: 10/03/2016 Elsevier Patient Education  2020 [employer].

## 2019-12-16 NOTE — Assessment & Plan Note (Signed)
Repeat lipids pending.  

## 2019-12-16 NOTE — Progress Notes (Signed)
   Subjective:    Patient ID: Alicia Nelson, female    DOB: 1945/09/13, 74 y.o.   MRN: [id number]  HPI  This visit occurred during the SARS-CoV-2 public health emergency.  Safety protocols were in place, including screening questions prior to the visit, additional usage of staff PPE, and extensive cleaning of exam room while observing appropriate contact time as indicated for disinfecting solutions.   Alicia Nelson is a 74 year old female patient with a history of hypertension, chronic kidney disease & hyperlipidemia here today with a chief complaint of low back pain and medication refill.   She has been having chronic midline thoracic back pain for the last 3-4 years. Denies any injury or trauma leading to this pain. This pain is worse with standing and any physical activity. She began doing water aerobics twice a week around 3 months ago. This has not changed this pain. She also takes 650 mg Tylenol usually once a day which seems to help this pain. Denies any radiation of this pain. Denies any leg weakness, numbness or tingling. Denies any urinary symptoms. Denies any fevers.    Pt is currently treated with lisinopril for hypertension which she has been on for the last one year. She has been complaint to this. She has been taking blood pressure at home which at typically is around 120-130/ 70-80. Denies any dizziness, lightheadedness or chest pain.    BP Readings from Last 3 Encounters:  12/16/19 118/84  10/08/18 136/72  09/24/18 (!) 144/90      Review of Systems  Constitutional: Negative.   Respiratory: Negative.   Cardiovascular: Negative.  Negative for chest pain.  Genitourinary: Negative.  Negative for dysuria, flank pain and urgency.  Musculoskeletal: Positive for back pain. Negative for gait problem.  Skin: Negative.   Neurological: Negative.  Negative for dizziness, light-headedness and numbness.       Objective:   Physical Exam Constitutional:      Appearance: Normal  appearance.  Eyes:     Pupils: Pupils are equal, round, and reactive to light.  Cardiovascular:     Rate and Rhythm: Normal rate and regular rhythm.     Pulses: Normal pulses.     Heart sounds: Normal heart sounds.  Pulmonary:     Effort: Pulmonary effort is normal.     Breath sounds: Normal breath sounds.  Musculoskeletal:        General: No swelling, tenderness, deformity or signs of injury. Normal range of motion.     Cervical back: Normal range of motion and neck supple.       Back:  Skin:    General: Skin is warm and dry.     Capillary Refill: Capillary refill takes less than 2 seconds.  Neurological:     General: No focal deficit present.     Mental Status: She is alert and oriented to person, place, and time.  Psychiatric:        Mood and Affect: Mood normal.        Behavior: Behavior normal.           Assessment & Plan:

## 2019-12-16 NOTE — Assessment & Plan Note (Addendum)
Chronic and ongoing for last 3-4 years. No trauma. Suspect osteoarthritis, need to consider compression fracture at this age given risk for osteoporosis. She has declined bone density scans in the past.  Due to persistence in pain will obtain xray to rule out compression fracture. Continue water aerobics & Tylenol for pain.     Agree with assessment and plan. Doreene Nest, NP

## 2019-12-16 NOTE — Assessment & Plan Note (Addendum)
BP Readings from Last 3 Encounters:  12/16/19 118/84  10/08/18 136/72  09/24/18 (!) 144/90   Blood pressure controlled today in office, diastolic borderline however home reading have been 120s-130s/70s.  Continue on lisinopril 20 mg daily. Repeat CMP pending.    Agree with assessment and plan. Doreene Nest, NP

## 2019-12-16 NOTE — Progress Notes (Signed)
Subjective:    Patient ID: Alicia Nelson, female    DOB: [birth year], 74 y.o.   MRN: [id number]  HPI  This visit occurred during the SARS-CoV-2 public health emergency.  Safety protocols were in place, including screening questions prior to the visit, additional usage of staff PPE, and extensive cleaning of exam room while observing appropriate contact time as indicated for disinfecting solutions.   Alicia Nelson is a 74 year old female with a history of hypertension, CKD, hyperlipidemia who presents today for follow up and a chief complaint of back pain.  1) Essential Hypertension: Currently managed on lisinopril 20 mg. She is checking her BP at home a few times weekly which is running 120's-130's/70's. She denies chest pain, dizziness, shortness of breath.  2) Chronic Back Pain: Located to mid thoracic back for years for which she notices upon standing and with increased exertion. She started water aerobics a few weeks ago and hasn't noticed much improvement. She's taking Tylenol Arthritis 650 mg once daily with improvement in pain.   She denies radiation of pain, urinary symptoms, numbness/tingling, trauma. No recent bone density scan on file. She is compliant to calcium and vitamin D.    Review of Systems  Eyes: Negative for visual disturbance.  Respiratory: Negative for shortness of breath.   Cardiovascular: Negative for chest pain.  Musculoskeletal: Positive for back pain.  Neurological: Negative for dizziness, numbness and headaches.       Past Medical History:  Diagnosis Date  . History of kidney stones   . Hypertension   . Polyp in nasopharynx      Social History   Socioeconomic History  . Marital status: Widowed    Spouse name: Not on file  . Number of children: Not on file  . Years of education: Not on file  . Highest education level: Not on file  Occupational History  . Not on file  Tobacco Use  . Smoking status: Never Smoker  . Smokeless tobacco: Never Used    Vaping Use  . Vaping Use: Never used  Substance and Sexual Activity  . Alcohol use: Never  . Drug use: Never  . Sexual activity: Not on file  Other Topics Concern  . Not on file  Social History Narrative   Widowed.   1 child. 2 grandchildren, 2 great grandchildren.   Retired.    Enjoys exercising at the gym.    Social Determinants of Health   Financial Resource Strain:   . Difficulty of Paying Living Expenses: Not on file  Food Insecurity:   . Worried About Programme researcher, broadcasting/film/video in the Last Year: Not on file  . Ran Out of Food in the Last Year: Not on file  Transportation Needs:   . Lack of Transportation (Medical): Not on file  . Lack of Transportation (Non-Medical): Not on file  Physical Activity:   . Days of Exercise per Week: Not on file  . Minutes of Exercise per Session: Not on file  Stress:   . Feeling of Stress : Not on file  Social Connections:   . Frequency of Communication with Friends and Family: Not on file  . Frequency of Social Gatherings with Friends and Family: Not on file  . Attends Religious Services: Not on file  . Active Member of Clubs or Organizations: Not on file  . Attends Banker Meetings: Not on file  . Marital Status: Not on file  Intimate Partner Violence:   . Fear of Current  or Ex-Partner: Not on file  . Emotionally Abused: Not on file  . Physically Abused: Not on file  . Sexually Abused: Not on file    Past Surgical History:  Procedure Laterality Date  . CYSTOSCOPY W/ RETROGRADES Right 11/18/2017   Procedure: CYSTOSCOPY WITH RETROGRADE PYELOGRAM;  Surgeon: Vanna Scotland, MD;  Location: [facility];  Service: Urology;  Laterality: Right;  . CYSTOSCOPY/URETEROSCOPY/HOLMIUM LASER/STENT PLACEMENT Right 11/18/2017   Procedure: CYSTOSCOPY/URETEROSCOPY/HOLMIUM LASER/STENT PLACEMENT;  Surgeon: Vanna Scotland, MD;  Location: [facility];  Service: Urology;  Laterality: Right;  . EXTRACORPOREAL SHOCK WAVE LITHOTRIPSY Right 11/07/2017    Procedure: EXTRACORPOREAL SHOCK WAVE LITHOTRIPSY (ESWL);  Surgeon: Vanna Scotland, MD;  Location: [facility];  Service: Urology;  Laterality: Right;  . TUBAL LIGATION      Family History  Problem Relation Age of Onset  . Diabetes Father   . Colon cancer Father     No Known Allergies  Current Outpatient Medications on File Prior to Visit  Medication Sig Dispense Refill  . Acetaminophen (TYLENOL ARTHRITIS PAIN PO) Take by mouth.    . calcium carbonate (OS-CAL - DOSED IN MG OF ELEMENTAL CALCIUM) 1250 (500 Ca) MG tablet Take 1 tablet by mouth 2 (two) times daily.    Marland Kitchen lisinopril (ZESTRIL) 20 MG tablet TAKE 1 TABLET BY MOUTH DAILY. FOR BLOOD PRESSURE. DX CODE: I10 30 tablet 0  . Multiple Vitamin (MULTIVITAMIN WITH MINERALS) TABS tablet Take 1 tablet by mouth daily.    . vitamin C (ASCORBIC ACID) 500 MG tablet Take 1,000 mg by mouth 2 (two) times daily.     No current facility-administered medications on file prior to visit.    BP 118/84   Pulse 82   Temp 97.6 F (36.4 C)   Ht 5\' 2"  (1.575 m)   Wt 125 lb (56.7 kg)   SpO2 97%   BMI 22.86 kg/m    Objective:   Physical Exam Cardiovascular:     Rate and Rhythm: Normal rate and regular rhythm.  Pulmonary:     Effort: Pulmonary effort is normal.     Breath sounds: Normal breath sounds.  Musculoskeletal:     Cervical back: Neck supple.     Thoracic back: No signs of trauma, tenderness or bony tenderness. Normal range of motion.       Back:  Skin:    General: Skin is warm and dry.            Assessment & Plan:

## 2019-12-17 LAB — HEPATITIS C ANTIBODY
Hepatitis C Ab: NONREACTIVE
SIGNAL TO CUT-OFF: 0.01 (ref <1.00)

## 2019-12-18 ENCOUNTER — Other Ambulatory Visit: Payer: Self-pay

## 2019-12-18 MED ORDER — ROSUVASTATIN CALCIUM 10 MG PO TABS: 10.0000 mg | ORAL_TABLET | Freq: Every day | ORAL | 0 refills | Status: DC

## 2020-01-04 ENCOUNTER — Other Ambulatory Visit: Payer: Self-pay | Admitting: Primary Care

## 2020-01-04 DIAGNOSIS — I10 Essential (primary) hypertension: Secondary | ICD-10-CM

## 2020-01-19 IMAGING — CT CT ABD-PELV W/ CM
2 of 5 series · 14 of 46 positions shown, 16 images · IV contrast (iopamidol)
Comparison: None available.

CLINICAL DATA: Initial evaluation for acute bilateral lower
abdominal pain, nausea, vomiting.

EXAM:
CT ABDOMEN AND PELVIS WITH CONTRAST
TECHNIQUE: Multidetector CT imaging of the abdomen and pelvis was performed
using the standard protocol following bolus administration of
intravenous contrast.
CONTRAST:  100mL F1X9XJ-WPP IOPAMIDOL (F1X9XJ-WPP) INJECTION 61%

[Series 2: axial st · axial · 0.70mm/px · z∈[-715,-275]mm · 11 of 100 slices shown, 13 images]
[im 6/100  soft-tissue]
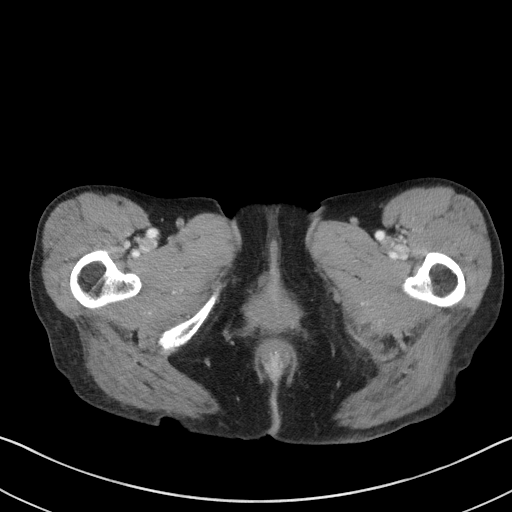
[im 6/100  bone]
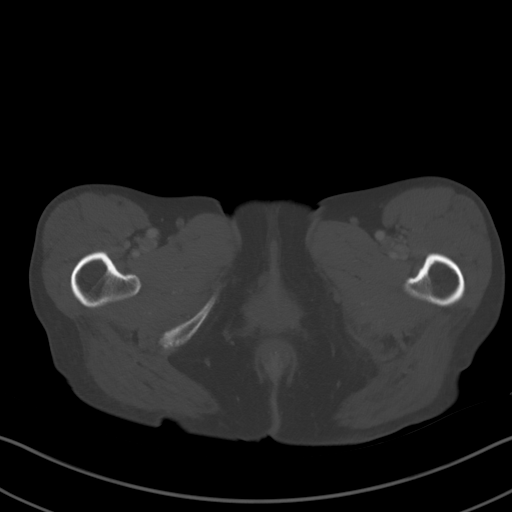
[im 16/100  soft-tissue]
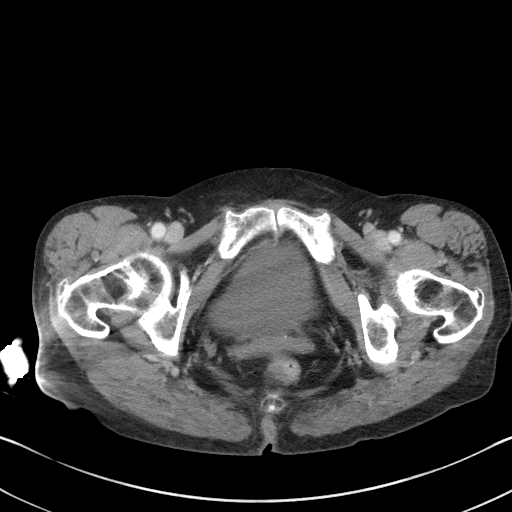
[im 27/100  soft-tissue]
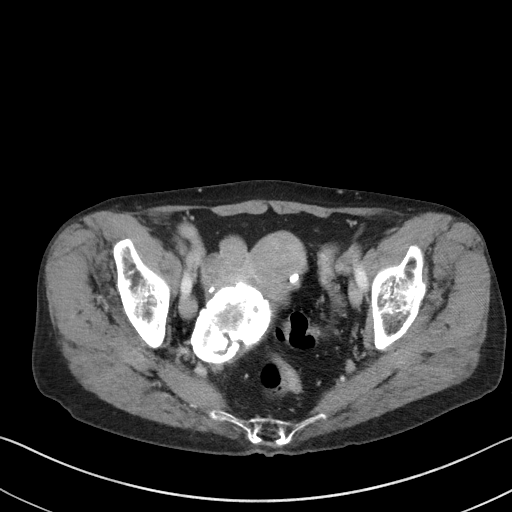
[im 32/100  soft-tissue]
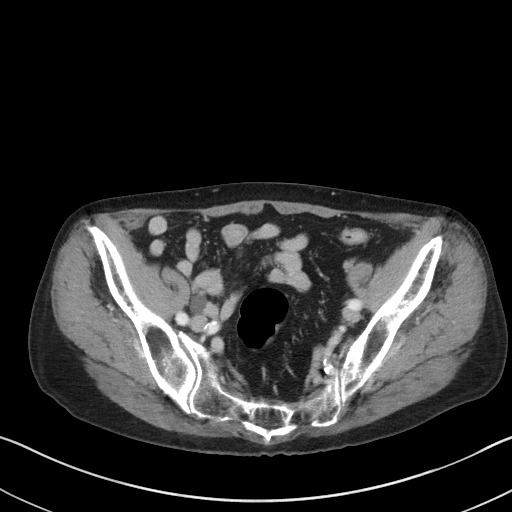
[im 42/100  soft-tissue]
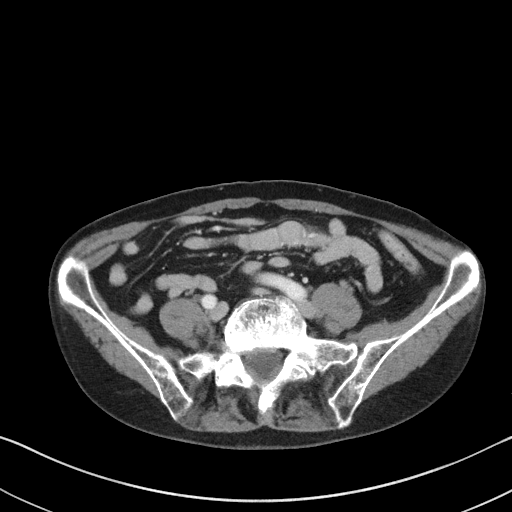
[im 53/100  soft-tissue]
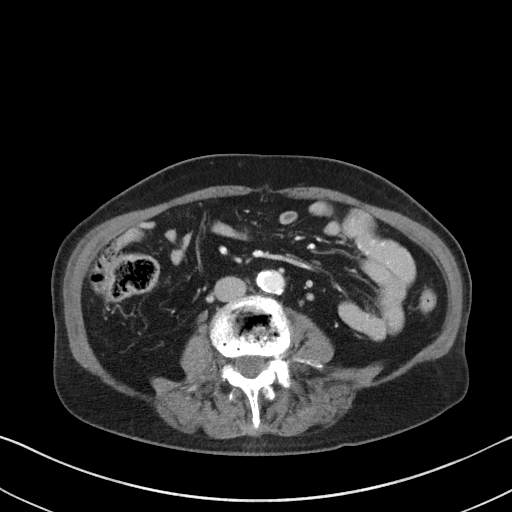
[im 58/100  soft-tissue]
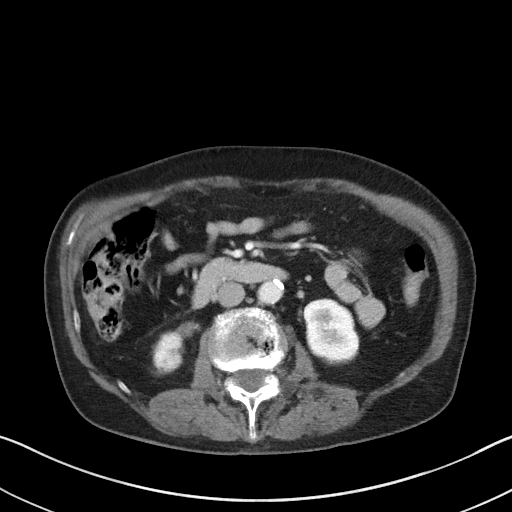
[im 68/100  soft-tissue]
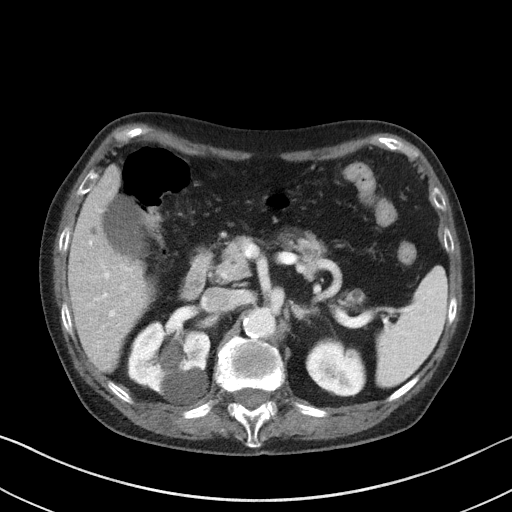
[im 73/100  soft-tissue]
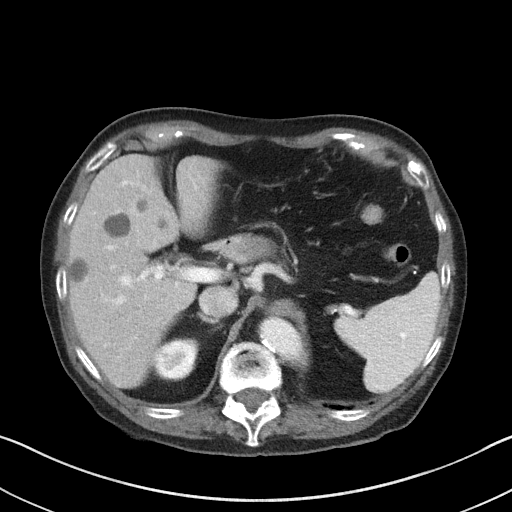
[im 73/100  bone]
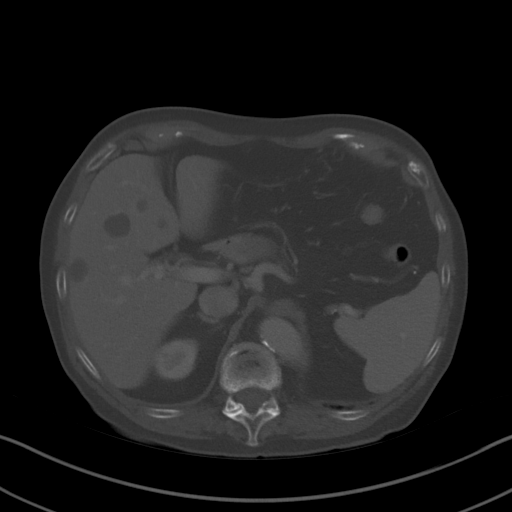
[im 84/100  soft-tissue]
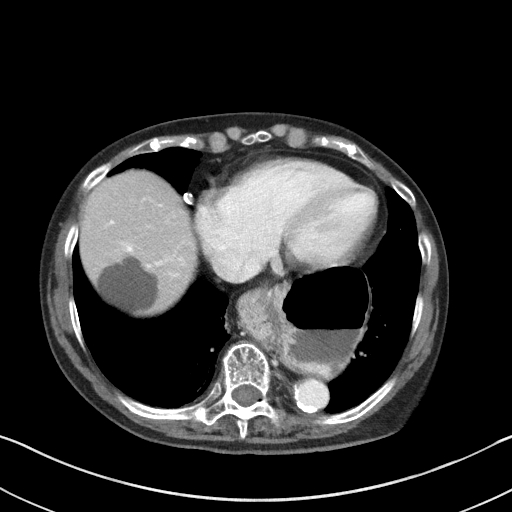
[im 94/100  soft-tissue]
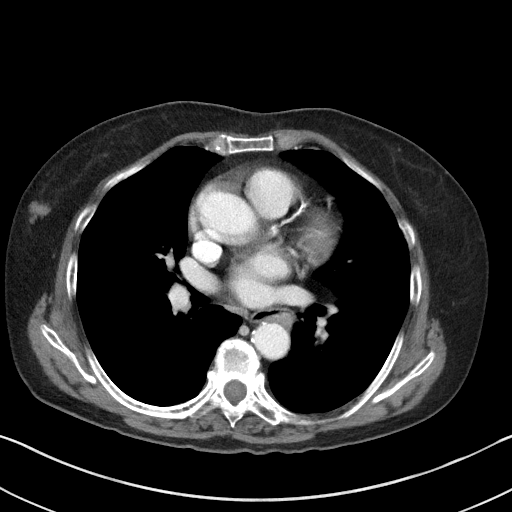

[Series 5: coronal st · coronal · 0.71mm/px · 3 of 85 slices shown]
[im 29/85  soft-tissue]
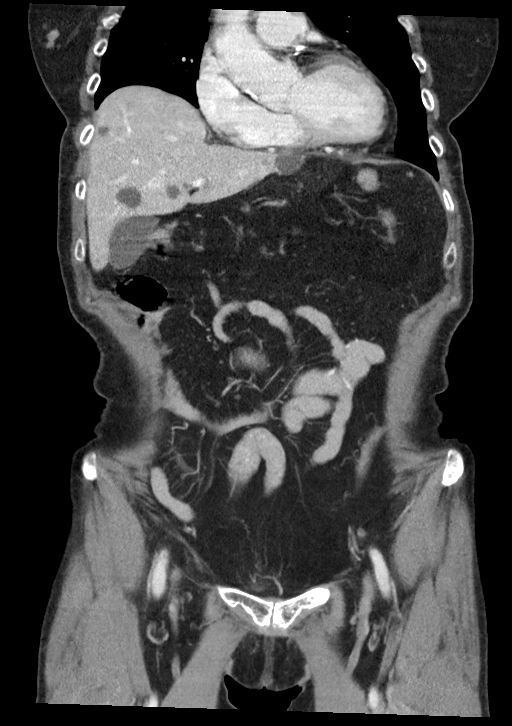
[im 38/85  soft-tissue]
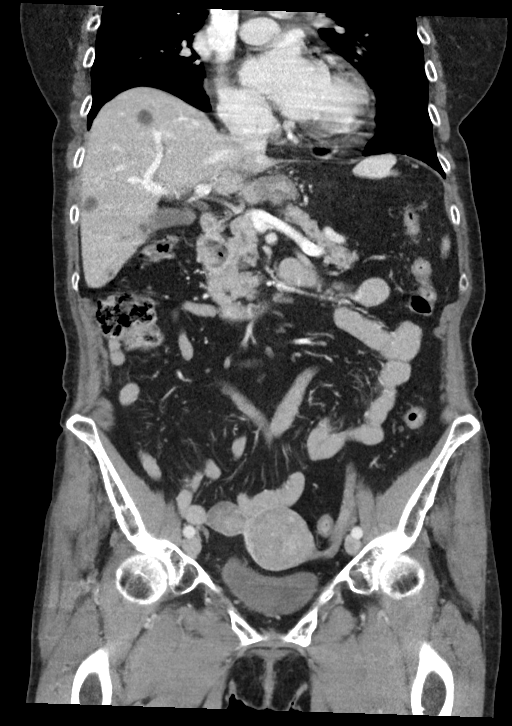
[im 47/85  soft-tissue]
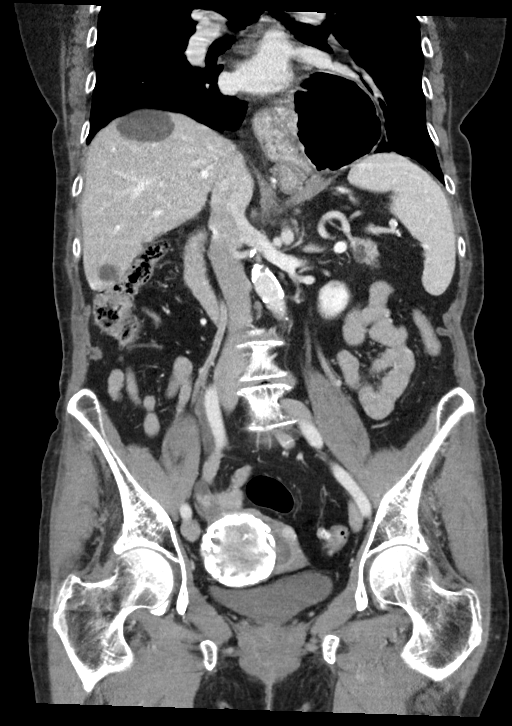

[14 of 46 positions shown; findings below may reference images not displayed]

FINDINGS: Lower chest: Scattered subsegmental atelectatic changes present
within the visualized lung bases. Visualized lungs are otherwise
clear. Prominent coronary artery calcifications noted.

Hepatobiliary: Multiple cysts seen within the liver, largest of
which positioned at the hepatic dome and measures 4.5 cm in size.
Liver otherwise unremarkable. Gallbladder within normal limits. No
biliary dilatation.

Pancreas: Several small cystic lesions present within the pancreas,
most discrete of which positioned at the pancreatic tail and
measures up to 8 mm (series 2, image 33). No abnormal pancreatic
ductal dilatation. No acute peripancreatic inflammation.

Spleen: Spleen within normal limits.

Adrenals/Urinary Tract: Adrenal glands are normal. Kidneys equal in
size. 3.4 cm cyst present within the interpolar right kidney. Few
additional scattered subcentimeter hypodensities too small the
characterize, but statistically likely reflects small cysts as well.
No focal enhancing renal masses. Punctate nonobstructive stone
present within the interpolar left kidney. On the right, there is an
8 mm obstructive stone lodged at the right UPJ with secondary
moderate right hydronephrosis. Right ureter mildly dilated distally.
Additional nonobstructive stones measuring up to 6 mm present within
the right kidney. No other radiopaque calculi identified. Partially
distended bladder within normal limits. No layering stones within
the bladder lumen.

Stomach/Bowel: Large hiatal hernia with the majority of the stomach
position within the lower thorax. Circumferential wall thickening
about the gastric antrum, which could be related to vomiting or
gastritis. No evidence for bowel obstruction. Colonic diverticulosis
without evidence for acute diverticulitis. No other acute
inflammatory changes about the bowels. No findings to suggest acute
appendicitis.

Vascular/Lymphatic: Diffuse tortuosity the intra-abdominal aorta
with moderate aorto bi-iliac atherosclerotic disease. No aneurysm.
Mesenteric vessels patent proximally. No adenopathy.

Reproductive: Enlarged heterogeneous fibroid uterus, with large
calcified fibroid measuring 8.1 x 5.2 x 5.7 cm. Uterus otherwise
unremarkable. 13 mm right adnexal cyst noted, of doubtful
significance. Ovaries otherwise unremarkable.

Other: No free air or fluid.

Musculoskeletal: Remotely healed fracture of the left superior and
inferior pubic rami noted. Multiple chronic compression deformities
seen throughout the visualized thoracolumbar spine. No acute osseous
abnormality. No discrete lytic or blastic osseous lesions.
Osteopenia noted.
IMPRESSION: 1. 8 mm obstructive stone at the right UPJ with secondary moderate
right hydronephrosis. Additional bilateral nonobstructive
nephrolithiasis as above.
2. Large hiatal hernia with the majority of the stomach position
within the lower thorax. Mild wall thickening about the gastric
antrum may be related to vomiting and/or mild acute gastritis.
3. Colonic diverticulosis without evidence for acute diverticulitis.
4. Moderate to advanced atherosclerosis with coronary artery
calcifications.
5. Fibroid uterus.
6. **An incidental finding of potential clinical significance has
been found. Several small pancreatic cysts measuring up to 8 mm in
diameter. Recommend follow up pre and post contrast MRI/MRCP or
pancreatic protocol CT in 2 years. This recommendation follows ACR
consensus guidelines: Management of Incidental Pancreatic Cysts: A
White Paper of the ACR Incidental Findings Committee. [HOSPITAL] 1383;[DATE].**

## 2020-02-05 ENCOUNTER — Other Ambulatory Visit: Payer: Self-pay | Admitting: Primary Care

## 2020-02-05 DIAGNOSIS — E785 Hyperlipidemia, unspecified: Secondary | ICD-10-CM

## 2020-02-17 ENCOUNTER — Other Ambulatory Visit (INDEPENDENT_AMBULATORY_CARE_PROVIDER_SITE_OTHER): Payer: Medicare HMO

## 2020-02-17 ENCOUNTER — Telehealth: Payer: Self-pay | Admitting: Primary Care

## 2020-02-17 ENCOUNTER — Other Ambulatory Visit: Payer: Self-pay

## 2020-02-17 DIAGNOSIS — E785 Hyperlipidemia, unspecified: Secondary | ICD-10-CM | POA: Diagnosis not present

## 2020-02-17 LAB — LIPID PANEL
Cholesterol: 147 mg/dL (ref 0–200)
HDL: 48.1 mg/dL (ref >39.00)
NonHDL: 98.75
Total CHOL/HDL Ratio: 3
Triglycerides: 223 mg/dL — ABNORMAL HIGH (ref 0.0–149.0)
VLDL: 44.6 mg/dL — ABNORMAL HIGH (ref 0.0–40.0)

## 2020-02-17 LAB — HEPATIC FUNCTION PANEL
ALT: 13 U/L (ref 0–35)
AST: 15 U/L (ref 0–37)
Albumin: 4.1 g/dL (ref 3.5–5.2)
Alkaline Phosphatase: 57 U/L (ref 39–117)
Bilirubin, Direct: 0.1 mg/dL (ref 0.0–0.3)
Total Bilirubin: 0.8 mg/dL (ref 0.2–1.2)
Total Protein: 6.5 g/dL (ref 6.0–8.3)

## 2020-02-17 LAB — LDL CHOLESTEROL, DIRECT: Direct LDL: 79 mg/dL

## 2020-02-17 NOTE — Telephone Encounter (Signed)
Patient came into office stating she was told she is needing to have a bone density test. Patient is inquiring if order can be placed so she can get it done. Please advise.

## 2020-02-17 NOTE — Telephone Encounter (Signed)
Ok to place order 

## 2020-02-17 NOTE — Telephone Encounter (Signed)
Yes, okay to order with diagnosis of estrogen deficiency. Not sure where she would like to have this done.Marland KitchenMarland Kitchen

## 2020-02-18 NOTE — Telephone Encounter (Signed)
Voicemail not set up.

## 2020-02-22 NOTE — Telephone Encounter (Signed)
Vm not set up

## 2020-02-29 NOTE — Telephone Encounter (Signed)
Alicia Nelson, did you place the order or do I need to?

## 2020-02-29 NOTE — Telephone Encounter (Signed)
Patient came into the office and was wondering why we had been calling her. Informed patient that we wanted to know where she would like to have her bone density scan done. Patient stated that she would like to have the test done in [place] if possible. Patient stated that she is very [place] and would like for [place] to reach out to her daughter [name] (number in the chart).

## 2020-03-01 ENCOUNTER — Other Ambulatory Visit: Payer: Self-pay

## 2020-03-01 DIAGNOSIS — E2839 Other primary ovarian failure: Secondary | ICD-10-CM

## 2020-03-01 NOTE — Progress Notes (Signed)
x

## 2020-03-01 NOTE — Telephone Encounter (Signed)
Order placed to [facility] with note to call daughter to make appointment.

## 2020-03-15 ENCOUNTER — Other Ambulatory Visit: Payer: Self-pay | Admitting: Primary Care

## 2020-03-15 NOTE — Telephone Encounter (Signed)
Pharmacy requests refill on: Rosuvastatin 10 mg   LAST REFILL: 12/18/2019 (Q-90, R-0) LAST OV: 12/16/2019 NEXT OV: Not Scheduled  PHARMACY: [id number] [place], [place]

## 2020-07-09 ENCOUNTER — Other Ambulatory Visit: Payer: Self-pay | Admitting: Primary Care

## 2020-07-09 DIAGNOSIS — I10 Essential (primary) hypertension: Secondary | ICD-10-CM

## 2020-08-18 ENCOUNTER — Telehealth: Payer: Medicare HMO | Admitting: Primary Care

## 2020-08-18 ENCOUNTER — Telehealth: Payer: Self-pay

## 2020-08-18 ENCOUNTER — Other Ambulatory Visit: Payer: Self-pay

## 2020-08-18 NOTE — Telephone Encounter (Signed)
Noted  

## 2020-08-18 NOTE — Telephone Encounter (Signed)
Called patient number x 3 went to voice mail. Called daughter. She states that mom is not able to do virtual and not able to hear on phone. She is not sure why she made the appointment. She requested that we cancel appointment. I have done so. She sates that mom is doing ok on over the counter medications and will give our office a call if any changes.

## 2020-09-07 ENCOUNTER — Other Ambulatory Visit: Payer: Self-pay | Admitting: Primary Care

## 2020-09-07 DIAGNOSIS — E785 Hyperlipidemia, unspecified: Secondary | ICD-10-CM

## 2020-09-08 ENCOUNTER — Telehealth: Payer: Self-pay | Admitting: *Deleted

## 2020-09-08 NOTE — Chronic Care Management (AMB) (Signed)
  Chronic Care Management   Outreach Note  09/08/2020 Name: Alicia Nelson MRN: [id number] DOB: February 12, 1946  Alicia Nelson is a 75 y.o. year old female who is a primary care patient of Doreene Nest, NP. I reached out to William Hamburger by phone today in response to a referral sent by Ms. Marline Backbone [place]'s PCP Doreene Nest, NP     An unsuccessful telephone outreach was attempted today. The patient was referred to the case management team for assistance with care management and care coordination.   Follow Up Plan: A HIPAA compliant phone message was left for the patient providing contact information and requesting a return call.  If patient returns call to provider office, please advise to call Embedded Care Management Care Guide Makale Pindell at [phone]  Burman Nieves, CCMA Care Guide, Embedded Care Coordination [facility]  Care Management  Direct Dial: [phone]

## 2020-09-19 NOTE — Chronic Care Management (AMB) (Signed)
  Chronic Care Management   Outreach Note  09/19/2020 Name: CHADE PITNER MRN: [id number] DOB: September 03, 1945  SAHAR RYBACK is a 75 y.o. year old female who is a primary care patient of Doreene Nest, NP. I reached out to William Hamburger by phone today in response to a referral sent by Ms. Marline Backbone [place]'s PCP Doreene Nest, NP     A second unsuccessful telephone outreach was attempted today. The patient was referred to the case management team for assistance with care management and care coordination.   Follow Up Plan: A HIPAA compliant phone message was left for the patient providing contact information and requesting a return call.  If patient returns call to provider office, please advise to call Embedded Care Management Care Guide Jajaira Ruis at [phone]  Burman Nieves, CCMA Care Guide, Embedded Care Coordination [facility]  Care Management  Direct Dial: [phone]

## 2020-09-28 NOTE — Chronic Care Management (AMB) (Signed)
  Chronic Care Management   Note  09/28/2020 Name: Alicia Nelson MRN: [id number] DOB: Sep 10, 1945  Alicia Nelson is a 75 y.o. year old female who is a primary care patient of Pleas Koch, NP. I reached out to Alicia Nelson by phone today in response to a referral sent by Alicia Nelson's PCP Pleas Koch, NP     Alicia Nelson was given information about Chronic Care Management services today including:  CCM service includes personalized support from designated clinical staff supervised by her physician, including individualized plan of care and coordination with other care providers 24/7 contact phone numbers for assistance for urgent and routine care needs. Service will only be billed when office clinical staff spend 20 minutes or more in a month to coordinate care. Only one practitioner may furnish and bill the service in a calendar month. The patient may stop CCM services at any time (effective at the end of the month) by phone call to the office staff. The patient will be responsible for cost sharing (co-pay) of up to 20% of the service fee (after annual deductible is met).  Patient agreed to services and verbal consent obtained.   Follow up plan: Telephone appointment with care management team member scheduled for: 10/20/2020  Julian Hy, [place] Management  Direct Dial: [phone]

## 2020-10-20 ENCOUNTER — Ambulatory Visit (INDEPENDENT_AMBULATORY_CARE_PROVIDER_SITE_OTHER): Payer: Medicare HMO

## 2020-10-20 DIAGNOSIS — E785 Hyperlipidemia, unspecified: Secondary | ICD-10-CM

## 2020-10-20 DIAGNOSIS — I10 Essential (primary) hypertension: Secondary | ICD-10-CM

## 2020-10-20 NOTE — Patient Instructions (Signed)
Visit Information :  Thank you for taking the time to speak with me.  PATIENT GOALS:   Goals Addressed             This Visit's Progress    Track and Manage My Blood Pressure-Hypertension / Hyperlipidemia   On track    Timeframe:  Long-Range Goal Priority:  Medium Start Date:    10/20/2020                         Expected End Date:  02/01/2021                     Follow Up Date 12/05/2020    - check blood pressure 1-2 times per week- write blood pressure results in a log or diary - Follow up with your doctor as recommended - Take your medications as prescribed.  Refill medications timely - Continue to incorporate exercise/ activity into your daily routine - Notify your doctor if you have new or ongoing symptoms - Eat a low salt, Heart healthy diet ( see education article sent to you in the mail on Low Salt, heart healthy diet)   Why is this important?   You won't feel high blood pressure, but it can still hurt your blood vessels.  High blood pressure can cause heart or kidney problems. It can also cause a stroke.  Making lifestyle changes like losing a little weight or eating less salt will help.  Checking your blood pressure at home and at different times of the day can help to control blood pressure.  If the doctor prescribes medicine remember to take it the way the doctor ordered.  Call the office if you cannot afford the medicine or if there are questions about it.             Consent to CCM Services: Ms. Donnelly was given information about Chronic Care Management services including:  CCM service includes personalized support from designated clinical staff supervised by her physician, including individualized plan of care and coordination with other care providers 24/7 contact phone numbers for assistance for urgent and routine care needs. Service will only be billed when office clinical staff spend 20 minutes or more in a month to coordinate care. Only one practitioner  may furnish and bill the service in a calendar month. The patient may stop CCM services at any time (effective at the end of the month) by phone call to the office staff. The patient will be responsible for cost sharing (co-pay) of up to 20% of the service fee (after annual deductible is met).  Patient agreed to services and verbal consent obtained.   The patient verbalized understanding of instructions, educational materials, and care plan provided today and agreed to receive a mailed copy of patient instructions, educational materials, and care plan.   The patient has been provided with contact information for the care management team and has been advised to call with any health related questions or concerns.  The care management team will reach out to the patient again over the next 45 days.   Quinn Plowman RN,BSN,CCM RN Case Manager [facility]  [phone]   CLINICAL CARE PLAN: Patient Care Plan: Cardiovascular     Problem Identified: Hypertension / Hyperlipidemia   Priority: Medium     Long-Range Goal: Hypertension / Hyperlipidemia Monitored   Start Date: 10/20/2020  Expected End Date: 02/01/2021  This Visit's Progress: On track  Priority: Medium  Note:   Objective:  Last practice recorded BP readings:  BP Readings from Last 3 Encounters:  12/16/19 118/84  10/08/18 136/72  09/24/18 (!) 144/90  Most recent lipid panel:     Component Value Date/Time   CHOL 147 02/17/2020 0918   TRIG 223.0 (H) 02/17/2020 0918   HDL 48.10 02/17/2020 0918   CHOLHDL 3 02/17/2020 0918   VLDL 44.6 (H) 02/17/2020 0918   LDLDIRECT 79.0 02/17/2020 0918  Current Barriers:  Knowledge Deficits related to long term care plan for self management of hypertension / Hyperlipidemia Difficulty with hearing.  Wears bilateral hearing aids Case Manager Clinical Goal(s):  patient will verbalize understanding of plan for hypertension / Hyperlipidemia management patient will attend sscheduled  medical appointments patient will demonstrate improved adherence to prescribed treatment plan for hypertension as evidenced by taking all medications as prescribed, monitoring and recording blood pressure as directed, adhering to low sodium/DASH diet Patient will take her medications as prescribed  Interventions:  Collaboration with Pleas Koch, NP regarding development and update of comprehensive plan of care as evidenced by provider attestation and co-signature Inter-disciplinary care team collaboration (see longitudinal plan of care) Evaluation of current treatment plan related to hypertension / Hyperlipidemia self management and patient's adherence to plan as established by provider. Provided education to patient re: stroke prevention, s/s of heart attack and stroke, DASH diet, complications of uncontrolled blood pressure Reviewed medications with patient and discussed importance of compliance Discussed plans with patient for ongoing care management follow up and provided patient with direct contact information for care management team Advised patient, providing education and rationale, to monitor blood pressure daily and record, calling PCP for findings outside established parameters.  Reviewed scheduled/upcoming provider appointments Patient Goals: - check blood pressure 1-2 times per week- write blood pressure results in a log or diary - Follow up with your doctor as recommended - Take your medications as prescribed.  Refill medications timely - Continue to incorporate exercise/ activity into your daily routine - Notify your doctor if you have new or ongoing symptoms - Eat a low salt, Heart healthy diet ( see education article sent to you in the mail on Low Salt, heart healthy diet) Follow Up Plan: The patient has been provided with contact information for the care management team and has been advised to call with any health related questions or concerns.  The care management team  will reach out to the patient again over the next 45 days.

## 2020-10-20 NOTE — Chronic Care Management (AMB) (Signed)
Chronic Care Management   CCM RN Visit Note  10/20/2020 Name: Alicia Nelson MRN: [id number] DOB: May 04, 1945  Subjective: Alicia Nelson is a 75 y.o. year old female who is a primary care patient of Pleas Koch, NP. The care management team was consulted for assistance with disease management and care coordination needs.    Engaged with patient by telephone for initial visit in response to provider referral for case management and/or care coordination services.   Consent to Services:  The patient was given the following information about Chronic Care Management services today, agreed to services, and gave verbal consent: 1. CCM service includes personalized support from designated clinical staff supervised by the primary care provider, including individualized plan of care and coordination with other care providers 2. 24/7 contact phone numbers for assistance for urgent and routine care needs. 3. Service will only be billed when office clinical staff spend 20 minutes or more in a month to coordinate care. 4. Only one practitioner may furnish and bill the service in a calendar month. 5.The patient may stop CCM services at any time (effective at the end of the month) by phone call to the office staff. 6. The patient will be responsible for cost sharing (co-pay) of up to 20% of the service fee (after annual deductible is met). Patient agreed to services and consent obtained.  Patient agreed to services and verbal consent obtained.   Assessment: Review of patient past medical history, allergies, medications, health status, including review of consultants reports, laboratory and other test data, was performed as part of comprehensive evaluation and provision of chronic care management services.   SDOH (Social Determinants of Health) assessments and interventions performed:  SDOH Interventions    Flowsheet Row Most Recent Value  SDOH Interventions   Food Insecurity Interventions Intervention  Not Indicated  Housing Interventions Intervention Not Indicated  Transportation Interventions Intervention Not Indicated        CCM Care Plan  No Known Allergies  Outpatient Encounter Medications as of 10/20/2020  Medication Sig   Acetaminophen (TYLENOL ARTHRITIS PAIN PO) Take by mouth.   calcium carbonate (OS-CAL - DOSED IN MG OF ELEMENTAL CALCIUM) 1250 (500 Ca) MG tablet Take 1 tablet by mouth 2 (two) times daily.   lisinopril (ZESTRIL) 20 MG tablet TAKE 1 TABLET BY MOUTH DAILY FOR BLOOD PRESSURE   Multiple Vitamin (MULTIVITAMIN WITH MINERALS) TABS tablet Take 1 tablet by mouth daily.   rosuvastatin (CRESTOR) 10 MG tablet TAKE 1 TABLET BY MOUTH EVERY DAY for cholesterol.   vitamin C (ASCORBIC ACID) 500 MG tablet Take 1,000 mg by mouth 2 (two) times daily.   No facility-administered encounter medications on file as of 10/20/2020.    Patient Active Problem List   Diagnosis Date Noted   Chronic midline thoracic back pain 12/16/2019   Hyperlipidemia 10/08/2018   CKD (chronic kidney disease) stage 3, GFR 30-59 ml/min ([place]) 04/10/2018   Essential hypertension 01/14/2018   History of renal stone 01/14/2018   Family history of colon cancer in father 01/14/2018    Conditions to be addressed/monitored:HTN and HLD  Care Plan : Cardiovascular  Updates made by Dannielle Karvonen, RN since 10/20/2020 12:00 AM     Problem: Hypertension / Hyperlipidemia   Priority: Medium     Long-Range Goal: Hypertension / Hyperlipidemia Monitored   Start Date: 10/20/2020  Expected End Date: 02/01/2021  This Visit's Progress: On track  Priority: Medium  Note:   Objective:  Last practice recorded BP readings:  BP Readings from Last 3 Encounters:  12/16/19 118/84  10/08/18 136/72  09/24/18 (!) 144/90  Most recent lipid panel:     Component Value Date/Time   CHOL 147 02/17/2020 0918   TRIG 223.0 (H) 02/17/2020 0918   HDL 48.10 02/17/2020 0918   CHOLHDL 3 02/17/2020 0918   VLDL 44.6 (H)  02/17/2020 0918   LDLDIRECT 79.0 02/17/2020 0918  Current Barriers:  Knowledge Deficits related to long term care plan for self management of hypertension / Hyperlipidemia Difficulty with hearing.  Wears bilateral hearing aids Case Manager Clinical Goal(s):  patient will verbalize understanding of plan for hypertension / Hyperlipidemia management patient will attend sscheduled medical appointments patient will demonstrate improved adherence to prescribed treatment plan for hypertension as evidenced by taking all medications as prescribed, monitoring and recording blood pressure as directed, adhering to low sodium/DASH diet Patient will take her medications as prescribed  Interventions:  Collaboration with Pleas Koch, NP regarding development and update of comprehensive plan of care as evidenced by provider attestation and co-signature Inter-disciplinary care team collaboration (see longitudinal plan of care) Evaluation of current treatment plan related to hypertension / Hyperlipidemia self management and patient's adherence to plan as established by provider. Provided education to patient re: stroke prevention, s/s of heart attack and stroke, DASH diet, complications of uncontrolled blood pressure Reviewed medications with patient and discussed importance of compliance Discussed plans with patient for ongoing care management follow up and provided patient with direct contact information for care management team Advised patient, providing education and rationale, to monitor blood pressure daily and record, calling PCP for findings outside established parameters.  Reviewed scheduled/upcoming provider appointments Patient Goals: - check blood pressure 1-2 times per week- write blood pressure results in a log or diary - Follow up with your doctor as recommended - Take your medications as prescribed.  Refill medications timely - Continue to incorporate exercise/ activity into your daily  routine - Notify your doctor if you have new or ongoing symptoms - Eat a low salt, Heart healthy diet ( see education article sent to you in the mail on Low Salt, heart healthy diet) Follow Up Plan: The patient has been provided with contact information for the care management team and has been advised to call with any health related questions or concerns.  The care management team will reach out to the patient again over the next 45 days.         Plan:The patient has been provided with contact information for the care management team and has been advised to call with any health related questions or concerns.  and The care management team will reach out to the patient again over the next 45 days. Quinn Plowman RN,BSN,CCM RN Case Manager [place]  [phone]

## 2020-11-15 ENCOUNTER — Encounter: Payer: Medicare HMO | Admitting: Primary Care

## 2020-12-03 ENCOUNTER — Other Ambulatory Visit: Payer: Self-pay | Admitting: Primary Care

## 2020-12-03 DIAGNOSIS — E785 Hyperlipidemia, unspecified: Secondary | ICD-10-CM

## 2020-12-05 ENCOUNTER — Telehealth: Payer: Self-pay

## 2020-12-05 ENCOUNTER — Telehealth: Payer: Medicare HMO

## 2020-12-05 NOTE — Telephone Encounter (Addendum)
  Care Management   Follow Up Note   12/05/2020 Name: Alicia Nelson MRN: [id number] DOB: 1946/02/15   Referred by: Doreene Nest, NP Reason for referral : Chronic Care Management (HTN/ HLD)   An unsuccessful telephone outreach was attempted today. The patient was referred to the case management team for assistance with care management and care coordination. Telephone call to patient. Unable to reach patient or leave voice message due to voice mail box being set up.   Follow Up Plan: A HIPPA compliant phone message was left for the patient providing contact information and requesting a return call.   George Ina RN,BSN,CCM RN Case Manager [facility] [place]  [phone]

## 2020-12-06 ENCOUNTER — Telehealth: Payer: Self-pay | Admitting: *Deleted

## 2020-12-06 NOTE — Chronic Care Management (AMB) (Signed)
  Care Management   Note  12/06/2020 Name: Alicia Nelson MRN: [id number] DOB: 10-22-1945  Alicia Nelson is a 75 y.o. year old female who is a primary care patient of Doreene Nest, NP and is actively engaged with the care management team. I reached out to William Hamburger by phone today to assist with re-scheduling a follow up visit with the RN Case Manager  Follow up plan: Unsuccessful telephone outreach attempt made. A HIPAA compliant phone message was left for the patient providing contact information and requesting a return call.   Burman Nieves, CCMA Care Guide, Embedded Care Coordination [facility]  Care Management  Direct Dial: [phone]

## 2020-12-09 NOTE — Chronic Care Management (AMB) (Signed)
  Care Management   Note  12/09/2020 Name: Alicia Nelson MRN: [id number] DOB: 1945/06/05  Alicia Nelson is a 75 y.o. year old female who is a primary care patient of Doreene Nest, NP and is actively engaged with the care management team. I reached out to William Hamburger by phone today to assist with re-scheduling a follow up visit with the RN Case Manager  Follow up plan: 2nd Unsuccessful telephone outreach attempt made. A HIPAA compliant phone message was left for the patient providing contact information and requesting a return call.   Burman Nieves, CCMA Care Guide, Embedded Care Coordination [facility]  Care Management  Direct Dial: [phone]

## 2020-12-20 DIAGNOSIS — E559 Vitamin D deficiency, unspecified: Secondary | ICD-10-CM | POA: Diagnosis not present

## 2020-12-20 DIAGNOSIS — E785 Hyperlipidemia, unspecified: Secondary | ICD-10-CM | POA: Diagnosis not present

## 2020-12-20 DIAGNOSIS — M129 Arthropathy, unspecified: Secondary | ICD-10-CM | POA: Diagnosis not present

## 2020-12-20 DIAGNOSIS — R5383 Other fatigue: Secondary | ICD-10-CM | POA: Diagnosis not present

## 2020-12-20 DIAGNOSIS — I10 Essential (primary) hypertension: Secondary | ICD-10-CM | POA: Diagnosis not present

## 2020-12-21 DIAGNOSIS — R5383 Other fatigue: Secondary | ICD-10-CM | POA: Diagnosis not present

## 2020-12-21 DIAGNOSIS — E559 Vitamin D deficiency, unspecified: Secondary | ICD-10-CM | POA: Diagnosis not present

## 2020-12-21 DIAGNOSIS — M129 Arthropathy, unspecified: Secondary | ICD-10-CM | POA: Diagnosis not present

## 2020-12-21 DIAGNOSIS — I10 Essential (primary) hypertension: Secondary | ICD-10-CM | POA: Diagnosis not present

## 2020-12-21 DIAGNOSIS — E785 Hyperlipidemia, unspecified: Secondary | ICD-10-CM | POA: Diagnosis not present

## 2020-12-21 NOTE — Chronic Care Management (AMB) (Signed)
  Care Management   Note  12/21/2020 Name: Alicia Nelson MRN: [id number] DOB: [birth year]  Alicia Nelson is a 75 y.o. year old female who is a primary care patient of Doreene Nest, NP and is actively engaged with the care management team. I reached out to William Hamburger by phone today to assist with re-scheduling a follow up visit with the RN Case Manager  Follow up plan: We have been unable to make contact with the patient for follow up. The care management team is available to follow up with the patient after provider conversation with the patient regarding recommendation for care management engagement and subsequent re-referral to the care management team.   Burman Nieves, CCMA Care Guide, Embedded Care Coordination [facility]  Care Management  Direct Dial: [phone]

## 2020-12-28 ENCOUNTER — Other Ambulatory Visit: Payer: Self-pay | Admitting: Primary Care

## 2020-12-28 DIAGNOSIS — E785 Hyperlipidemia, unspecified: Secondary | ICD-10-CM

## 2021-01-04 ENCOUNTER — Encounter: Payer: Medicare HMO | Admitting: Primary Care

## 2021-01-04 ENCOUNTER — Telehealth: Payer: Self-pay

## 2021-01-04 NOTE — Telephone Encounter (Signed)
Appt cancelled

## 2021-01-04 NOTE — Telephone Encounter (Signed)
Please notify patient that I am sorry that she had to cancel her appointment.  I will need to see her in the office in order to continue her medication refills.  Please reschedule at her earliest convenience

## 2021-01-04 NOTE — Telephone Encounter (Signed)
[facility] Night - Client Nonclinical Telephone Record  [facility] Client [facility] Night - Client Client Site [facility] - Night Physician Vernona Rieger - NP Contact Type Call Who Is Calling Patient / Member / Family / Caregiver Caller Name Schwanda Zima Caller Phone Number [phone] Patient Name Alicia Nelson Patient DOB [birth year] Call Type Message Only Information Provided Reason for Call Request to [facility] Appointment Initial Comment Caller states she is wanting to cancel her appt. for tomorrow. Patient request to speak to RN No Additional Comment Provided office hours. Declined triage. Disp. Time Disposition Final User 01/03/2021 7:01:12 PM General Information Provided Yes JOHNS, MATHEW Call Closed By: Jacques Navy Transaction Date/Time: 01/03/2021 6:58:40 PM (ET

## 2021-01-19 DIAGNOSIS — I10 Essential (primary) hypertension: Secondary | ICD-10-CM | POA: Diagnosis not present

## 2021-01-19 DIAGNOSIS — N289 Disorder of kidney and ureter, unspecified: Secondary | ICD-10-CM | POA: Diagnosis not present

## 2021-08-28 ENCOUNTER — Ambulatory Visit: Payer: Self-pay

## 2021-08-28 DIAGNOSIS — I10 Essential (primary) hypertension: Secondary | ICD-10-CM

## 2021-08-28 DIAGNOSIS — E785 Hyperlipidemia, unspecified: Secondary | ICD-10-CM

## 2022-07-09 ENCOUNTER — Ambulatory Visit: Payer: Medicare HMO | Admitting: Physician Assistant

## 2022-07-11 ENCOUNTER — Emergency Department: Payer: Medicare HMO

## 2022-07-11 ENCOUNTER — Emergency Department
Admission: EM | Admit: 2022-07-11 | Discharge: 2022-07-11 | Disposition: A | Discharge status: Home / Self Care | Payer: Medicare HMO | Attending: Emergency Medicine | Admitting: Emergency Medicine

## 2022-07-11 ENCOUNTER — Encounter: Payer: Self-pay | Admitting: Emergency Medicine

## 2022-07-11 ENCOUNTER — Other Ambulatory Visit: Payer: Self-pay

## 2022-07-11 DIAGNOSIS — Z23 Encounter for immunization: Secondary | ICD-10-CM | POA: Diagnosis not present

## 2022-07-11 DIAGNOSIS — I129 Hypertensive chronic kidney disease with stage 1 through stage 4 chronic kidney disease, or unspecified chronic kidney disease: Secondary | ICD-10-CM | POA: Insufficient documentation

## 2022-07-11 DIAGNOSIS — S52512A Displaced fracture of left radial styloid process, initial encounter for closed fracture: Secondary | ICD-10-CM | POA: Insufficient documentation

## 2022-07-11 DIAGNOSIS — N183 Chronic kidney disease, stage 3 unspecified: Secondary | ICD-10-CM | POA: Insufficient documentation

## 2022-07-11 DIAGNOSIS — S52612A Displaced fracture of left ulna styloid process, initial encounter for closed fracture: Secondary | ICD-10-CM | POA: Insufficient documentation

## 2022-07-11 DIAGNOSIS — S52502A Unspecified fracture of the lower end of left radius, initial encounter for closed fracture: Secondary | ICD-10-CM

## 2022-07-11 DIAGNOSIS — W010XXA Fall on same level from slipping, tripping and stumbling without subsequent striking against object, initial encounter: Secondary | ICD-10-CM | POA: Insufficient documentation

## 2022-07-11 DIAGNOSIS — S6992XA Unspecified injury of left wrist, hand and finger(s), initial encounter: Secondary | ICD-10-CM | POA: Diagnosis present

## 2022-07-11 DIAGNOSIS — W19XXXA Unspecified fall, initial encounter: Secondary | ICD-10-CM

## 2022-07-11 DIAGNOSIS — Y92000 Kitchen of unspecified non-institutional (private) residence as  the place of occurrence of the external cause: Secondary | ICD-10-CM | POA: Insufficient documentation

## 2022-07-11 MED ORDER — OXYCODONE HCL 5 MG PO TABS: 5.0000 mg | ORAL_TABLET | Freq: Three times a day (TID) | ORAL | 0 refills | Status: AC | PRN

## 2022-07-11 MED ORDER — TETANUS-DIPHTH-ACELL PERTUSSIS 5-2.5-18.5 LF-MCG/0.5 IM SUSY
0.5000 mL | PREFILLED_SYRINGE | Freq: Once | INTRAMUSCULAR | Status: AC
  Administered 2022-07-11: 0.5 mL via INTRAMUSCULAR
  Filled 2022-07-11: qty 0.5

## 2022-07-11 MED ORDER — LIDOCAINE HCL (PF) 1 % IJ SOLN
5.0000 mL | Freq: Once | INTRAMUSCULAR | Status: AC
  Administered 2022-07-11: 5 mL
  Filled 2022-07-11: qty 5

## 2022-07-11 MED ORDER — MORPHINE SULFATE (PF) 4 MG/ML IV SOLN
4.0000 mg | Freq: Once | INTRAVENOUS | Status: AC
  Administered 2022-07-11: 4 mg via INTRAVENOUS
  Filled 2022-07-11: qty 1

## 2022-07-11 NOTE — Discharge Instructions (Signed)
It is very important that you keep your arm elevated is much as possible.  Please wear your sling while you are standing.  You may put ice over the top of your splint, though keep it clean and dry.  You may take the pain medication as needed, though remember that oxycodone is highly addictive and should only be used for breakthrough pain.  Also remember that you cannot drive, operate heavy machinery, or perform any tasks that require concentration while taking this medication.  You may also take Tylenol 650 mg every 6-8 hours or ibuprofen 600 mg every 6-8 hours to help with your pain.  You have an appointment with Dr. Donnald Garre on Tuesday at 10 AM.  Please return for any new, worsening, or change in symptoms or other concerns.  It was a pleasure caring for you today.

## 2022-07-11 NOTE — ED Notes (Signed)
Pt fell around 0845 this am, pt states that she put her left arm out to catch herself. Pt has a deformity to her left wrist, pt has a skin tear to the inner left forearm

## 2022-07-11 NOTE — ED Provider Notes (Signed)
[facility] Provider Note    Event Date/Time   First MD Initiated Contact with Patient 07/11/22 [phone]     (approximate)   History   Fall   HPI  Alicia Nelson is a 77 y.o. female with a past medical history of hyperlipidemia, CKD stage III, hypertension who presents today for evaluation after a fall.  Patient reports that she accidentally tripped and fell but caught herself in her left upper extremity.  Patient reports that she has significant pain in her left wrist.  She denies head strike or LOC.  She reports that she has been able to ambulate since this happened.  No numbness or tingling.  Patient Active Problem List   Diagnosis Date Noted   Chronic midline thoracic back pain 12/16/2019   Hyperlipidemia 10/08/2018   CKD (chronic kidney disease) stage 3, GFR 30-59 ml/min (HCC) 04/10/2018   Essential hypertension 01/14/2018   History of renal stone 01/14/2018   Family history of colon cancer in father 01/14/2018          Physical Exam   Triage Vital Signs: ED Triage Vitals [07/11/22 0927]  Enc Vitals Group     BP (!) 137/96     Pulse Rate 66     Resp 18     Temp 98.1 F (36.7 C)     Temp Source Oral     SpO2 97 %     Weight      Height      Head Circumference      Peak Flow      Pain Score 10     Pain Loc      Pain Edu?      Excl. in GC?     Most recent vital signs: Vitals:   07/11/22 0927  BP: (!) 137/96  Pulse: 66  Resp: 18  Temp: 98.1 F (36.7 C)  SpO2: 97%    Physical Exam Vitals and nursing note reviewed.  Constitutional:      General: Awake and alert. No acute distress.    Appearance: Normal appearance. The patient is normal weight.  HENT:     Head: Normocephalic and atraumatic.     Mouth: Mucous membranes are moist.  Eyes:     General: PERRL. Normal EOMs        Right eye: No discharge.        Left eye: No discharge.     Conjunctiva/sclera: Conjunctivae normal.  Cardiovascular:     Rate and Rhythm: Normal  rate and regular rhythm.     Pulses: Normal pulses.  Pulmonary:     Effort: Pulmonary effort is normal. No respiratory distress.     Breath sounds: Normal breath sounds.  Abdominal:     Abdomen is soft. There is no abdominal tenderness. No rebound or guarding. No distention. Musculoskeletal:        General: No swelling. Normal range of motion.     Cervical back: Normal range of motion and neck supple.  No cervical spine tenderness, full and normal range of motion of the neck Left wrist: Obvious deformity noted, with superficial skin tear noted to the palmar aspect of wrist.  Base of wound visualized and is skin tear only.  Normal radial pulse.  Sensation intact light touch throughout all fingers.  Tender to palpation throughout the dorsum and palmar aspect of the wrist.  No proximal forearm or elbow tenderness.  Full and normal range of motion of the elbow and the shoulder.  Compartment soft compressible throughout. Skin:    General: Skin is warm and dry.     Capillary Refill: Capillary refill takes less than 2 seconds.     Findings: No rash.  Neurological:     Mental Status: The patient is awake and alert.      ED Results / Procedures / Treatments   Labs (all labs ordered are listed, but only abnormal results are displayed) Labs Reviewed - No data to display   EKG     RADIOLOGY     PROCEDURES:  Critical Care performed:   .Ortho Injury Treatment  Date/Time: 07/11/2022 2:30 PM  Performed by: Jackelyn Hoehn, PA-C Authorized by: Jackelyn Hoehn, PA-C   Consent:    Consent obtained:  Verbal   Consent given by:  Patient   Risks discussed:  Fracture, nerve damage, restricted joint movement and vascular damage   Alternatives discussed:  Immobilization and referralInjury location: wrist Location details: left wrist Injury type: fracture Fracture type: distal radius, ulnar styloid and distal radius and ulnar styloid Pre-procedure neurovascular assessment: neurovascularly  intact Pre-procedure distal perfusion: normal Pre-procedure neurological function: normal Pre-procedure range of motion: normal Anesthesia: hematoma block  Anesthesia: Local anesthesia used: yes Local Anesthetic: lidocaine 1% without epinephrine Anesthetic total: 10 mL  Patient sedated: NoManipulation performed: yes Reduction successful: yes (improved) X-ray confirmed reduction: yes Immobilization: splint Splint type: sugar tong Splint Applied by: ED Provider and ED Tech Supplies used: cotton padding, elastic bandage and Ortho-Glass Post-procedure neurovascular assessment: post-procedure neurovascularly intact Post-procedure distal perfusion: normal Post-procedure neurological function: normal Post-procedure range of motion: improved      MEDICATIONS ORDERED IN ED: Medications  lidocaine (PF) (XYLOCAINE) 1 % injection 5 mL (5 mLs Infiltration Given 07/11/22 0958)  lidocaine (PF) (XYLOCAINE) 1 % injection 5 mL (5 mLs Infiltration Given 07/11/22 0958)  morphine (PF) 4 MG/ML injection 4 mg (4 mg Intravenous Given 07/11/22 0957)  Tdap (BOOSTRIX) injection 0.5 mL (0.5 mLs Intramuscular Given 07/11/22 0959)  morphine (PF) 4 MG/ML injection 4 mg (4 mg Intravenous Given 07/11/22 1148)     IMPRESSION / MDM / ASSESSMENT AND PLAN / ED COURSE  I reviewed the triage vital signs and the nursing notes.   Differential diagnosis includes, but is not limited to, fracture, dislocation, ligament injury.  Patient is awake and alert, hemodynamically stable and afebrile.  She is neurovascularly intact.  No headstrike or LOC. No other injuries sustained. She has an obvious deformity to her left wrist and x-rays obtained in triage reveal a comminuted fracture of the distal radius and distal ulna with extension into the radiocarpal joint, displaced fracture of the ulnar styloid, and widening of the scapholunate interval.  I discussed these findings with Dr. Martha Clan with orthopedics who agrees with bedside  reduction, splinting, and outpatient follow-up.  Hematoma block was performed with good effect, patient was placed in the finger traps, and wrist was reduced.  There is improved angulation but persistent posterior lateral displacement of the distal radial and ulnar fractures, and I again discussed with Dr. Martha Clan who feels that this alignment is satisfactory and that patient may be discharged to follow-up with Dr. Stephenie Acres on Tuesday at 10 AM.  Patient and her daughter are in agreement with this plan.  We discussed the importance of aggressive elevation, and also patient was given a sling and instructed to wear this while she is upright.  We discussed analgesia with Tylenol/Motrin, and she was given oxycodone for breakthrough pain when these medicines do not work.  She was advised that oxycodone is highly addictive and to only use for breakthrough pain.  She was also advised that she cannot drive, operate heavy machinery, or perform any test that require concentration will take this medication.  Patient understands and agrees with plan.  Case also discussed with Dr. Larinda Buttery who agrees with assessment and plan.  Patient's presentation is most consistent with acute presentation with potential threat to life or bodily function.   Clinical Course as of 07/11/22 1432  Wed Jul 11, 2022  1012 Discussed with Dr. Martha Clan who agrees with reduction and splinting, and patient will follow-up with Dr. Stephenie Acres [JP]  1231 Discussed with Dr. Martha Clan again who feels this reduction is satisfactory, and she will see Dr. Stephenie Acres on Tuesday at 10am. Strict elevation and sling when upright [JP]    Clinical Course User Index [JP] Heiress Williamson, Herb Grays, PA-C     FINAL CLINICAL IMPRESSION(S) / ED DIAGNOSES   Final diagnoses:  Closed displaced fracture of styloid process of left radius, initial encounter  Closed fracture of distal ends of left radius and ulna, initial encounter  Fall, initial encounter     Rx / DC Orders    ED Discharge Orders          Ordered    oxyCODONE (ROXICODONE) 5 MG immediate release tablet  Every 8 hours PRN        07/11/22 1239             Note:  This document was prepared using Dragon voice recognition software and may include unintentional dictation errors.   Keturah Shavers 07/11/22 1432    Chesley Noon, MD 07/11/22 [phone]

## 2022-07-11 NOTE — ED Triage Notes (Signed)
Patient to ED for fall. Patient states she fell in kitchen and caught herself with left wrist. Skin tear noted but pt states she feels like it is broke. Denies hitting head or blood thinners. Bleeding controlled at this time.

## 2023-03-07 DIAGNOSIS — M818 Other osteoporosis without current pathological fracture: Secondary | ICD-10-CM | POA: Diagnosis not present

## 2023-03-07 DIAGNOSIS — E119 Type 2 diabetes mellitus without complications: Secondary | ICD-10-CM | POA: Diagnosis not present

## 2023-03-07 DIAGNOSIS — E785 Hyperlipidemia, unspecified: Secondary | ICD-10-CM | POA: Diagnosis not present

## 2023-03-07 DIAGNOSIS — M129 Arthropathy, unspecified: Secondary | ICD-10-CM | POA: Diagnosis not present

## 2023-03-07 DIAGNOSIS — I10 Essential (primary) hypertension: Secondary | ICD-10-CM | POA: Diagnosis not present

## 2023-03-07 DIAGNOSIS — N289 Disorder of kidney and ureter, unspecified: Secondary | ICD-10-CM | POA: Diagnosis not present

## 2023-04-12 DIAGNOSIS — H04123 Dry eye syndrome of bilateral lacrimal glands: Secondary | ICD-10-CM | POA: Diagnosis not present

## 2023-04-12 DIAGNOSIS — H43813 Vitreous degeneration, bilateral: Secondary | ICD-10-CM | POA: Diagnosis not present

## 2023-04-12 DIAGNOSIS — H2513 Age-related nuclear cataract, bilateral: Secondary | ICD-10-CM | POA: Diagnosis not present

## 2023-06-04 DIAGNOSIS — N289 Disorder of kidney and ureter, unspecified: Secondary | ICD-10-CM | POA: Diagnosis not present

## 2023-06-04 DIAGNOSIS — E785 Hyperlipidemia, unspecified: Secondary | ICD-10-CM | POA: Diagnosis not present

## 2023-06-04 DIAGNOSIS — E119 Type 2 diabetes mellitus without complications: Secondary | ICD-10-CM | POA: Diagnosis not present

## 2023-06-04 DIAGNOSIS — M810 Age-related osteoporosis without current pathological fracture: Secondary | ICD-10-CM | POA: Diagnosis not present

## 2023-06-04 DIAGNOSIS — M129 Arthropathy, unspecified: Secondary | ICD-10-CM | POA: Diagnosis not present

## 2023-06-04 DIAGNOSIS — I10 Essential (primary) hypertension: Secondary | ICD-10-CM | POA: Diagnosis not present

## 2023-09-03 DIAGNOSIS — Z0000 Encounter for general adult medical examination without abnormal findings: Secondary | ICD-10-CM | POA: Diagnosis not present

## 2023-09-03 DIAGNOSIS — I10 Essential (primary) hypertension: Secondary | ICD-10-CM | POA: Diagnosis not present

## 2023-09-03 DIAGNOSIS — E119 Type 2 diabetes mellitus without complications: Secondary | ICD-10-CM | POA: Diagnosis not present

## 2023-09-03 DIAGNOSIS — N189 Chronic kidney disease, unspecified: Secondary | ICD-10-CM | POA: Diagnosis not present

## 2023-09-03 DIAGNOSIS — E785 Hyperlipidemia, unspecified: Secondary | ICD-10-CM | POA: Diagnosis not present

## 2023-09-03 DIAGNOSIS — M129 Arthropathy, unspecified: Secondary | ICD-10-CM | POA: Diagnosis not present

## 2023-09-03 DIAGNOSIS — R5383 Other fatigue: Secondary | ICD-10-CM | POA: Diagnosis not present

## 2023-09-03 DIAGNOSIS — M810 Age-related osteoporosis without current pathological fracture: Secondary | ICD-10-CM | POA: Diagnosis not present

## 2023-11-13 ENCOUNTER — Telehealth: Payer: Self-pay | Admitting: Cardiology

## 2023-11-13 NOTE — Telephone Encounter (Signed)
 Pt came in stating her appt isnt until next month but that she is about to run out of her ROSUVASTATIN  10 Mg, and needs it sent to [facility] on [address] if possible Thanks!

## 2023-11-14 ENCOUNTER — Other Ambulatory Visit: Payer: Self-pay | Admitting: Internal Medicine

## 2023-11-14 DIAGNOSIS — E785 Hyperlipidemia, unspecified: Secondary | ICD-10-CM

## 2023-11-14 MED ORDER — ROSUVASTATIN CALCIUM 10 MG PO TABS: ORAL_TABLET | ORAL | 0 refills | Status: DC

## 2023-12-09 ENCOUNTER — Ambulatory Visit: Payer: Self-pay | Admitting: Internal Medicine

## 2023-12-09 ENCOUNTER — Encounter: Payer: Self-pay | Admitting: Internal Medicine

## 2023-12-09 VITALS — BP 96/60 | HR 94 | Ht 62.0 in | Wt 125.6 lb

## 2023-12-09 DIAGNOSIS — M810 Age-related osteoporosis without current pathological fracture: Secondary | ICD-10-CM | POA: Diagnosis not present

## 2023-12-09 DIAGNOSIS — R7303 Prediabetes: Secondary | ICD-10-CM | POA: Insufficient documentation

## 2023-12-09 DIAGNOSIS — I10 Essential (primary) hypertension: Secondary | ICD-10-CM

## 2023-12-09 DIAGNOSIS — E782 Mixed hyperlipidemia: Secondary | ICD-10-CM | POA: Diagnosis not present

## 2023-12-09 DIAGNOSIS — Z87442 Personal history of urinary calculi: Secondary | ICD-10-CM | POA: Diagnosis not present

## 2023-12-09 DIAGNOSIS — E785 Hyperlipidemia, unspecified: Secondary | ICD-10-CM | POA: Diagnosis not present

## 2023-12-09 DIAGNOSIS — I152 Hypertension secondary to endocrine disorders: Secondary | ICD-10-CM | POA: Insufficient documentation

## 2023-12-09 MED ORDER — ROSUVASTATIN CALCIUM 10 MG PO TABS
ORAL_TABLET | ORAL | 0 refills | Status: DC
Start: 2023-12-09 — End: 2023-12-10

## 2023-12-09 MED ORDER — ALENDRONATE SODIUM 70 MG PO TABS: 70.0000 mg | ORAL_TABLET | ORAL | 3 refills | Status: DC

## 2023-12-09 NOTE — Progress Notes (Signed)
 New Patient Office Visit  Subjective   Patient ID: Alicia Nelson, female    DOB: 1945/12/26  Age: 78 y.o. MRN: [id number]  CC:  Chief Complaint  Patient presents with   Establish Care    NPE    HPI Alicia Nelson presents to establish care Previous Primary Care provider/office:   she does not have additional concerns to discuss today.   Patient is here today to establish care with our office as her PCP. She reports she is doing well and has no complaints. She has PMH of HTN, Hyperlipidemia, Prediabetes, osteoporosis, CKD 3a, hx kidney stones. Patient is HOH and wears hearing aids. Patient reports hx of polyp on her vocal chords but denies difficulty swallowing, breathing, or cough. States it has never bothered her. Denies chest pain, shortness of breath, headache, nausea, vomiting, diarrhea, constipation at this time.   She requests no preventative screenings for colonoscopy and mammogram. Patient last DEXA scan 2023; she is on Fosamax and calcium  supplements. Will order DEXA scan to be completed to monitor osteoporosis. Patient is up to date on her eye exam; denies abnormal findings.  She states she trys to eat healthy and does water aerobics 3 times a week to stay active.  Her PHQ-9 score and GAD-7 score today were both 0. She is fasting and due for blood work; will collect. Patient does not want a flu show at this time.     Outpatient Encounter Medications as of 12/09/2023  Medication Sig   Acetaminophen  (TYLENOL  ARTHRITIS PAIN PO) Take by mouth.   lisinopril  (ZESTRIL ) 20 MG tablet TAKE 1 TABLET BY MOUTH DAILY FOR BLOOD PRESSURE   [DISCONTINUED] alendronate (FOSAMAX) 70 MG tablet Take 70 mg by mouth once a week.   [DISCONTINUED] rosuvastatin  (CRESTOR ) 10 MG tablet TAKE 1 TABLET BY MOUTH EVERY DAY for cholesterol. Office visit required for further refills.   alendronate (FOSAMAX) 70 MG tablet Take 1 tablet (70 mg total) by mouth once a week.   calcium  carbonate (OS-CAL - DOSED IN  MG OF ELEMENTAL CALCIUM ) 1250 (500 Ca) MG tablet Take 1 tablet by mouth 2 (two) times daily. (Patient not taking: Reported on 12/09/2023)   Multiple Vitamin (MULTIVITAMIN WITH MINERALS) TABS tablet Take 1 tablet by mouth daily. (Patient not taking: Reported on 12/09/2023)   rosuvastatin  (CRESTOR ) 10 MG tablet TAKE 1 TABLET BY MOUTH EVERY DAY for cholesterol. Office visit required for further refills.   vitamin C (ASCORBIC ACID) 500 MG tablet Take 1,000 mg by mouth 2 (two) times daily. (Patient not taking: Reported on 12/09/2023)   No facility-administered encounter medications on file as of 12/09/2023.    Past Medical History:  Diagnosis Date   History of kidney stones    Hypertension    Polyp in nasopharynx     Past Surgical History:  Procedure Laterality Date   CYSTOSCOPY W/ RETROGRADES Right 11/18/2017   Procedure: CYSTOSCOPY WITH RETROGRADE PYELOGRAM;  Surgeon: Penne Knee, MD;  Location: [facility];  Service: Urology;  Laterality: Right;   CYSTOSCOPY/URETEROSCOPY/HOLMIUM LASER/STENT PLACEMENT Right 11/18/2017   Procedure: CYSTOSCOPY/URETEROSCOPY/HOLMIUM LASER/STENT PLACEMENT;  Surgeon: Penne Knee, MD;  Location: [facility];  Service: Urology;  Laterality: Right;   EXTRACORPOREAL SHOCK WAVE LITHOTRIPSY Right 11/07/2017   Procedure: EXTRACORPOREAL SHOCK WAVE LITHOTRIPSY (ESWL);  Surgeon: Penne Knee, MD;  Location: [facility];  Service: Urology;  Laterality: Right;   TUBAL LIGATION      Family History  Problem Relation Age of Onset   Diabetes Father  Colon cancer Father     Social History   Socioeconomic History   Marital status: Widowed    Spouse name: Not on file   Number of children: Not on file   Years of education: Not on file   Highest education level: Not on file  Occupational History   Not on file  Tobacco Use   Smoking status: Never   Smokeless tobacco: Never  Vaping Use   Vaping status: Never Used  Substance and Sexual Activity   Alcohol use: Never    Drug use: Never   Sexual activity: Not on file  Other Topics Concern   Not on file  Social History Narrative   Widowed.   1 child. 2 grandchildren, 2 great grandchildren.   Retired.    Enjoys exercising at the gym.    Social Drivers of Corporate investment banker Strain: Not on file  Food Insecurity: No Food Insecurity (10/20/2020)   Hunger Vital Sign    Worried About Running Out of Food in the Last Year: Never true    Ran Out of Food in the Last Year: Never true  Transportation Needs: No Transportation Needs (10/20/2020)   PRAPARE - Administrator, Civil Service (Medical): No    Lack of Transportation (Non-Medical): No  Physical Activity: Not on file  Stress: Not on file  Social Connections: Not on file  Intimate Partner Violence: Not on file    Review of Systems  Constitutional: Negative.  Negative for chills, fever and malaise/fatigue.  HENT: Negative.  Negative for congestion and sore throat.   Eyes: Negative.  Negative for blurred vision and pain.  Respiratory: Negative.  Negative for cough and shortness of breath.   Cardiovascular: Negative.  Negative for chest pain, palpitations and leg swelling.  Gastrointestinal: Negative.  Negative for abdominal pain, blood in stool, constipation, diarrhea, heartburn, melena, nausea and vomiting.  Genitourinary: Negative.  Negative for dysuria, flank pain, frequency and urgency.  Musculoskeletal: Negative.  Negative for joint pain and myalgias.  Skin: Negative.   Neurological: Negative.  Negative for dizziness, tingling, sensory change, weakness and headaches.  Endo/Heme/Allergies: Negative.   Psychiatric/Behavioral: Negative.  Negative for depression and suicidal ideas. The patient is not nervous/anxious.         Objective   BP 96/60   Pulse 94   Ht 5' 2 (1.575 m)   Wt 125 lb 9.6 oz (57 kg)   SpO2 94%   BMI 22.97 kg/m   Physical Exam Vitals and nursing note reviewed.  Constitutional:      Appearance:  Normal appearance.  HENT:     Head: Normocephalic and atraumatic.     Nose: Nose normal.     Mouth/Throat:     Mouth: Mucous membranes are moist.     Pharynx: Oropharynx is clear.  Eyes:     Conjunctiva/sclera: Conjunctivae normal.     Pupils: Pupils are equal, round, and reactive to light.  Cardiovascular:     Rate and Rhythm: Normal rate and regular rhythm.     Pulses: Normal pulses.     Heart sounds: Normal heart sounds. No murmur heard. Pulmonary:     Effort: Pulmonary effort is normal.     Breath sounds: Normal breath sounds. No wheezing.  Abdominal:     General: Bowel sounds are normal.     Palpations: Abdomen is soft.     Tenderness: There is no abdominal tenderness. There is no right CVA tenderness or left CVA tenderness.  Musculoskeletal:        General: Normal range of motion.     Cervical back: Normal range of motion.     Right lower leg: No edema.     Left lower leg: No edema.  Skin:    General: Skin is warm and dry.  Neurological:     General: No focal deficit present.     Mental Status: She is alert and oriented to person, place, and time.  Psychiatric:        Mood and Affect: Mood normal.        Behavior: Behavior normal.        Assessment & Plan:  Refills sent at patients request. Continue taking medications as prescribed. Will order DEXA scan. Routine fasting labs to be collected and FU with patient on results. Continue healthy diet and exercise as tolerated. Problem List Items Addressed This Visit     Essential hypertension, benign - Primary   Relevant Medications   rosuvastatin  (CRESTOR ) 10 MG tablet   Other Relevant Orders   CBC with Diff   CMP14+EGFR   History of kidney stones   Hyperlipidemia   Relevant Medications   rosuvastatin  (CRESTOR ) 10 MG tablet   Other Relevant Orders   CBC with Diff   CMP14+EGFR   Lipid Panel w/o Chol/HDL Ratio   Prediabetes   Relevant Orders   Hemoglobin A1c   Osteoporosis without current pathological  fracture   Relevant Medications   alendronate (FOSAMAX) 70 MG tablet   Other Relevant Orders   [facility] DEXA SCAN (Completed)    Return in about 3 months (around 03/10/2024).   Total time spent: 30 minutes  FERNAND FREDY RAMAN, MD  12/09/2023   This document may have been prepared by [facility] Voice Recognition software and as such may include unintentional dictation errors.

## 2023-12-10 ENCOUNTER — Ambulatory Visit: Payer: Self-pay | Admitting: Internal Medicine

## 2023-12-10 DIAGNOSIS — E782 Mixed hyperlipidemia: Secondary | ICD-10-CM

## 2023-12-10 LAB — CBC WITH DIFFERENTIAL/PLATELET
Basophils Absolute: 0 x10E3/uL (ref 0.0–0.2)
Basos: 0 %
EOS (ABSOLUTE): 0.1 x10E3/uL (ref 0.0–0.4)
Eos: 2 %
Hematocrit: 45.3 % (ref 34.0–46.6)
Hemoglobin: 15.4 g/dL (ref 11.1–15.9)
Immature Grans (Abs): 0 x10E3/uL (ref 0.0–0.1)
Immature Granulocytes: 0 %
Lymphocytes Absolute: 1.4 x10E3/uL (ref 0.7–3.1)
Lymphs: 25 %
MCH: 32.6 pg (ref 26.6–33.0)
MCHC: 34 g/dL (ref 31.5–35.7)
MCV: 96 fL (ref 79–97)
Monocytes Absolute: 0.6 x10E3/uL (ref 0.1–0.9)
Monocytes: 10 %
Neutrophils Absolute: 3.4 x10E3/uL (ref 1.4–7.0)
Neutrophils: 63 %
Platelets: 174 x10E3/uL (ref 150–450)
RBC: 4.73 x10E6/uL (ref 3.77–5.28)
RDW: 12.9 % (ref 11.7–15.4)
WBC: 5.5 x10E3/uL (ref 3.4–10.8)

## 2023-12-10 LAB — CMP14+EGFR
ALT: 19 IU/L (ref 0–32)
AST: 21 IU/L (ref 0–40)
Albumin: 4.6 g/dL (ref 3.8–4.8)
Alkaline Phosphatase: 58 IU/L (ref 49–135)
BUN/Creatinine Ratio: 15 (ref 12–28)
BUN: 17 mg/dL (ref 8–27)
Bilirubin Total: 0.9 mg/dL (ref 0.0–1.2)
CO2: 26 mmol/L (ref 20–29)
Calcium: 10.3 mg/dL (ref 8.7–10.3)
Chloride: 102 mmol/L (ref 96–106)
Creatinine, Ser: 1.17 mg/dL — ABNORMAL HIGH (ref 0.57–1.00)
Globulin, Total: 2 g/dL (ref 1.5–4.5)
Glucose: 91 mg/dL (ref 70–99)
Potassium: 4.6 mmol/L (ref 3.5–5.2)
Sodium: 143 mmol/L (ref 134–144)
Total Protein: 6.6 g/dL (ref 6.0–8.5)
eGFR: 47 mL/min/1.73 — ABNORMAL LOW (ref >59)

## 2023-12-10 LAB — LIPID PANEL W/O CHOL/HDL RATIO
Cholesterol, Total: 177 mg/dL (ref 100–199)
HDL: 48 mg/dL (ref >39)
LDL Chol Calc (NIH): 94 mg/dL (ref 0–99)
Triglycerides: 203 mg/dL — ABNORMAL HIGH (ref 0–149)
VLDL Cholesterol Cal: 35 mg/dL (ref 5–40)

## 2023-12-10 LAB — HEMOGLOBIN A1C
Est. average glucose Bld gHb Est-mCnc: 134 mg/dL
Hgb A1c MFr Bld: 6.3 % — ABNORMAL HIGH (ref 4.8–5.6)

## 2023-12-10 MED ORDER — ROSUVASTATIN CALCIUM 20 MG PO TABS
20.0000 mg | ORAL_TABLET | Freq: Every day | ORAL | 1 refills | Status: AC
Start: 2023-12-10 — End: ?

## 2023-12-11 NOTE — Progress Notes (Signed)
Pt informed

## 2024-02-29 ENCOUNTER — Other Ambulatory Visit: Payer: Self-pay | Admitting: Internal Medicine

## 2024-02-29 DIAGNOSIS — M810 Age-related osteoporosis without current pathological fracture: Secondary | ICD-10-CM

## 2024-03-10 ENCOUNTER — Ambulatory Visit
Admission: RE | Admit: 2024-03-10 | Discharge: 2024-03-10 | Disposition: A | Discharge status: Home / Self Care | Attending: Internal Medicine | Admitting: Internal Medicine

## 2024-03-10 ENCOUNTER — Ambulatory Visit: Admitting: Internal Medicine

## 2024-03-10 ENCOUNTER — Ambulatory Visit
Admission: RE | Admit: 2024-03-10 | Discharge: 2024-03-10 | Disposition: A | Discharge status: Home / Self Care | Source: Ambulatory Visit | Attending: Internal Medicine | Admitting: Internal Medicine

## 2024-03-10 ENCOUNTER — Encounter: Payer: Self-pay | Admitting: Internal Medicine

## 2024-03-10 VITALS — BP 122/82 | HR 91 | Ht 62.0 in | Wt 128.2 lb

## 2024-03-10 DIAGNOSIS — M5440 Lumbago with sciatica, unspecified side: Secondary | ICD-10-CM | POA: Diagnosis present

## 2024-03-10 DIAGNOSIS — R7303 Prediabetes: Secondary | ICD-10-CM | POA: Diagnosis not present

## 2024-03-10 DIAGNOSIS — Z13820 Encounter for screening for osteoporosis: Secondary | ICD-10-CM | POA: Insufficient documentation

## 2024-03-10 DIAGNOSIS — I10 Essential (primary) hypertension: Secondary | ICD-10-CM

## 2024-03-10 DIAGNOSIS — E782 Mixed hyperlipidemia: Secondary | ICD-10-CM | POA: Diagnosis not present

## 2024-03-10 LAB — POC CREATINE & ALBUMIN,URINE
Creatinine, POC: 50 mg/dL
Microalbumin Ur, POC: 30 mg/L

## 2024-03-10 MED ORDER — PREDNISONE 20 MG PO TABS: 40.0000 mg | ORAL_TABLET | Freq: Every day | ORAL | 0 refills | Status: DC

## 2024-03-10 NOTE — Progress Notes (Signed)
 "  Established Patient Office Visit  Subjective:  Patient ID: Alicia Nelson, female    DOB: 06-27-1945  Age: 79 y.o. MRN: [id number]  Chief Complaint  Patient presents with   Follow-up    3 month follow up    Patient is here today for follow up. She reports feeling fairly well today but has new concerns to discuss at this time.  She reports right sided back pain and reports her leg will give out and then she gets a sharp pain sensation that travels down her leg. She reports this started in the last 3 weeks. She had been taking Tylenol  and it provided relief originally but it does not help anymore. She reports at this time she has no pain but it comes and goes. She had lumbar xray in 2023 that showed bone demineralization and multilevel joint space narrowing, DDD. Will get updated lumbar xray and start Prednisone  burst. Discussed patient trying to stay active and get back to her water aerobics. Will order physical therapy if symptoms don't improve with steroids.    No other concerns at this time.   Past Medical History:  Diagnosis Date   History of kidney stones    Hypertension    Polyp in nasopharynx     Past Surgical History:  Procedure Laterality Date   CYSTOSCOPY W/ RETROGRADES Right 11/18/2017   Procedure: CYSTOSCOPY WITH RETROGRADE PYELOGRAM;  Surgeon: Penne Knee, MD;  Location: [facility];  Service: Urology;  Laterality: Right;   CYSTOSCOPY/URETEROSCOPY/HOLMIUM LASER/STENT PLACEMENT Right 11/18/2017   Procedure: CYSTOSCOPY/URETEROSCOPY/HOLMIUM LASER/STENT PLACEMENT;  Surgeon: Penne Knee, MD;  Location: [facility];  Service: Urology;  Laterality: Right;   EXTRACORPOREAL SHOCK WAVE LITHOTRIPSY Right 11/07/2017   Procedure: EXTRACORPOREAL SHOCK WAVE LITHOTRIPSY (ESWL);  Surgeon: Penne Knee, MD;  Location: [facility];  Service: Urology;  Laterality: Right;   TUBAL LIGATION      Social History   Socioeconomic History   Marital status: Widowed    Spouse name: Not on file    Number of children: Not on file   Years of education: Not on file   Highest education level: Not on file  Occupational History   Not on file  Tobacco Use   Smoking status: Never   Smokeless tobacco: Never  Vaping Use   Vaping status: Never Used  Substance and Sexual Activity   Alcohol use: Never   Drug use: Never   Sexual activity: Not on file  Other Topics Concern   Not on file  Social History Narrative   Widowed.   1 child. 2 grandchildren, 2 great grandchildren.   Retired.    Enjoys exercising at the gym.    Social Drivers of Health   Tobacco Use: Low Risk (03/10/2024)   Patient History    Smoking Tobacco Use: Never    Smokeless Tobacco Use: Never    Passive Exposure: Not on file  Financial Resource Strain: Not on file  Food Insecurity: Not on file  Transportation Needs: Not on file  Physical Activity: Not on file  Stress: Not on file  Social Connections: Not on file  Intimate Partner Violence: Not on file  Depression (PHQ2-9): Low Risk (12/09/2023)   Depression (PHQ2-9)    PHQ-2 Score: 0  Alcohol Screen: Not on file  Housing: Not on file  Utilities: Not on file  Health Literacy: Not on file    Family History  Problem Relation Age of Onset   Diabetes Father    Colon cancer Father  Allergies[1]  Show/hide medication list[2]  Review of Systems  Constitutional: Negative.  Negative for chills, fever and malaise/fatigue.  HENT: Negative.  Negative for congestion and sore throat.   Eyes: Negative.  Negative for blurred vision and pain.  Respiratory: Negative.  Negative for cough and shortness of breath.   Cardiovascular: Negative.  Negative for chest pain, palpitations and leg swelling.  Gastrointestinal: Negative.  Negative for abdominal pain, blood in stool, constipation, diarrhea, heartburn, melena, nausea and vomiting.  Genitourinary: Negative.  Negative for dysuria, flank pain, frequency and urgency.  Musculoskeletal:  Positive for back pain (right  sided). Negative for joint pain and myalgias.  Skin: Negative.   Neurological:  Positive for sensory change (down right leg). Negative for dizziness, tingling, weakness and headaches.  Endo/Heme/Allergies: Negative.   Psychiatric/Behavioral: Negative.  Negative for depression and suicidal ideas. The patient is not nervous/anxious.        Objective:   BP 122/82   Pulse 91   Ht 5' 2 (1.575 m)   Wt 128 lb 3.2 oz (58.2 kg)   SpO2 96%   BMI 23.45 kg/m   Vitals:   03/10/24 1325  BP: 122/82  Pulse: 91  Height: 5' 2 (1.575 m)  Weight: 128 lb 3.2 oz (58.2 kg)  SpO2: 96%  BMI (Calculated): 23.44    Physical Exam Vitals and nursing note reviewed.  Constitutional:      Appearance: Normal appearance.  HENT:     Head: Normocephalic and atraumatic.     Nose: Nose normal.     Mouth/Throat:     Mouth: Mucous membranes are moist.     Pharynx: Oropharynx is clear.  Eyes:     Conjunctiva/sclera: Conjunctivae normal.     Pupils: Pupils are equal, round, and reactive to light.  Cardiovascular:     Rate and Rhythm: Normal rate and regular rhythm.     Pulses: Normal pulses.     Heart sounds: Normal heart sounds. No murmur heard. Pulmonary:     Effort: Pulmonary effort is normal.     Breath sounds: Normal breath sounds. No wheezing.  Abdominal:     General: Bowel sounds are normal.     Palpations: Abdomen is soft.     Tenderness: There is no abdominal tenderness. There is no right CVA tenderness or left CVA tenderness.  Musculoskeletal:        General: Normal range of motion.     Cervical back: Normal range of motion.     Right lower leg: No edema.     Left lower leg: No edema.  Skin:    General: Skin is warm and dry.  Neurological:     General: No focal deficit present.     Mental Status: She is alert and oriented to person, place, and time.  Psychiatric:        Mood and Affect: Mood normal.        Behavior: Behavior normal.      Results for orders placed or performed  in visit on 03/10/24  POC CREATINE & ALBUMIN,URINE  Result Value Ref Range   Microalbumin Ur, POC 30 mg/L   Creatinine, POC 50 mg/dL   Albumin/Creatinine Ratio, Urine, POC 30-300     Recent Results (from the past 2160 hours)  POC CREATINE & ALBUMIN,URINE     Status: Abnormal   Collection Time: 03/10/24  1:53 PM  Result Value Ref Range   Microalbumin Ur, POC 30 mg/L   Creatinine, POC 50 mg/dL   Albumin/Creatinine  Ratio, Urine, POC 30-300       Assessment & Plan:  Order lumbar xray. Start Prednisone  burst for 5 days. Continue medications as prescribed. Check routine blood work and FU with results. Bone density needs to be completed. Will ordered. Problem List Items Addressed This Visit       Cardiovascular and Mediastinum   Essential hypertension, benign - Primary   Relevant Orders   CMP14+EGFR   CBC with Diff     Nervous and Auditory   Acute right-sided low back pain with sciatica   Relevant Medications   predniSONE  (DELTASONE ) 20 MG tablet   Other Relevant Orders   DG Lumbar Spine Complete     Other   Hyperlipidemia   Relevant Orders   CMP14+EGFR   Lipid Panel w/o Chol/HDL Ratio   CBC with Diff   Prediabetes   Relevant Orders   Hemoglobin A1c   POC CREATINE & ALBUMIN,URINE (Completed)   Screening for osteoporosis   Relevant Orders   DG Bone Density    Return in about 10 days (around 03/20/2024).   Total time spent: 25 minutes. This time includes review of previous notes and results and patient face to face interaction during today's visit.    FERNAND FREDY RAMAN, MD  03/10/2024   This document may have been prepared by [facility] Voice Recognition software and as such may include unintentional dictation errors.     [1] No Known Allergies [2]  Outpatient Medications Prior to Visit  Medication Sig   Acetaminophen  (TYLENOL  ARTHRITIS PAIN PO) Take by mouth.   alendronate  (FOSAMAX ) 70 MG tablet TAKE 1 TABLET (70 MG TOTAL) BY MOUTH ONCE A WEEK   calcium  carbonate  (OS-CAL - DOSED IN MG OF ELEMENTAL CALCIUM ) 1250 (500 Ca) MG tablet Take 1 tablet by mouth 2 (two) times daily.   lisinopril  (ZESTRIL ) 20 MG tablet TAKE 1 TABLET BY MOUTH DAILY FOR BLOOD PRESSURE   Multiple Vitamin (MULTIVITAMIN WITH MINERALS) TABS tablet Take 1 tablet by mouth daily.   rosuvastatin  (CRESTOR ) 20 MG tablet Take 1 tablet (20 mg total) by mouth daily.   vitamin C (ASCORBIC ACID) 500 MG tablet Take 1,000 mg by mouth 2 (two) times daily.   No facility-administered medications prior to visit.   "

## 2024-03-11 ENCOUNTER — Ambulatory Visit: Payer: Self-pay | Admitting: Internal Medicine

## 2024-03-11 LAB — CMP14+EGFR
ALT: 18 IU/L (ref 0–32)
AST: 18 IU/L (ref 0–40)
Albumin: 4.5 g/dL (ref 3.8–4.8)
Alkaline Phosphatase: 58 IU/L (ref 49–135)
BUN/Creatinine Ratio: 13 (ref 12–28)
BUN: 13 mg/dL (ref 8–27)
Bilirubin Total: 0.9 mg/dL (ref 0.0–1.2)
CO2: 26 mmol/L (ref 20–29)
Calcium: 10 mg/dL (ref 8.7–10.3)
Chloride: 102 mmol/L (ref 96–106)
Creatinine, Ser: 0.99 mg/dL (ref 0.57–1.00)
Globulin, Total: 1.7 g/dL (ref 1.5–4.5)
Glucose: 87 mg/dL (ref 70–99)
Potassium: 4.1 mmol/L (ref 3.5–5.2)
Sodium: 143 mmol/L (ref 134–144)
Total Protein: 6.2 g/dL (ref 6.0–8.5)
eGFR: 58 mL/min/1.73 — ABNORMAL LOW

## 2024-03-11 LAB — CBC WITH DIFFERENTIAL/PLATELET
Basophils Absolute: 0 x10E3/uL (ref 0.0–0.2)
Basos: 0 %
EOS (ABSOLUTE): 0.1 x10E3/uL (ref 0.0–0.4)
Eos: 3 %
Hematocrit: 43.8 % (ref 34.0–46.6)
Hemoglobin: 14.7 g/dL (ref 11.1–15.9)
Immature Grans (Abs): 0 x10E3/uL (ref 0.0–0.1)
Immature Granulocytes: 0 %
Lymphocytes Absolute: 1.4 x10E3/uL (ref 0.7–3.1)
Lymphs: 24 %
MCH: 32.3 pg (ref 26.6–33.0)
MCHC: 33.6 g/dL (ref 31.5–35.7)
MCV: 96 fL (ref 79–97)
Monocytes Absolute: 0.6 x10E3/uL (ref 0.1–0.9)
Monocytes: 10 %
Neutrophils Absolute: 3.6 x10E3/uL (ref 1.4–7.0)
Neutrophils: 63 %
Platelets: 152 x10E3/uL (ref 150–450)
RBC: 4.55 x10E6/uL (ref 3.77–5.28)
RDW: 12.6 % (ref 11.7–15.4)
WBC: 5.7 x10E3/uL (ref 3.4–10.8)

## 2024-03-11 LAB — HEMOGLOBIN A1C
Est. average glucose Bld gHb Est-mCnc: 126 mg/dL
Hgb A1c MFr Bld: 6 % — ABNORMAL HIGH (ref 4.8–5.6)

## 2024-03-11 LAB — LIPID PANEL W/O CHOL/HDL RATIO
Cholesterol, Total: 155 mg/dL (ref 100–199)
HDL: 55 mg/dL
LDL Chol Calc (NIH): 72 mg/dL (ref 0–99)
Triglycerides: 167 mg/dL — ABNORMAL HIGH (ref 0–149)
VLDL Cholesterol Cal: 28 mg/dL (ref 5–40)

## 2024-03-12 NOTE — Progress Notes (Signed)
 Pt. Notified.

## 2024-03-20 ENCOUNTER — Ambulatory Visit: Admitting: Internal Medicine

## 2024-03-20 ENCOUNTER — Ambulatory Visit: Payer: Self-pay | Admitting: Internal Medicine

## 2024-03-20 ENCOUNTER — Encounter: Payer: Self-pay | Admitting: Internal Medicine

## 2024-03-20 VITALS — BP 108/78 | HR 105 | Ht 62.0 in | Wt 130.0 lb

## 2024-03-20 DIAGNOSIS — R7303 Prediabetes: Secondary | ICD-10-CM

## 2024-03-20 DIAGNOSIS — M810 Age-related osteoporosis without current pathological fracture: Secondary | ICD-10-CM | POA: Diagnosis not present

## 2024-03-20 DIAGNOSIS — E782 Mixed hyperlipidemia: Secondary | ICD-10-CM | POA: Diagnosis not present

## 2024-03-20 DIAGNOSIS — R519 Headache, unspecified: Secondary | ICD-10-CM | POA: Diagnosis not present

## 2024-03-20 DIAGNOSIS — I10 Essential (primary) hypertension: Secondary | ICD-10-CM | POA: Diagnosis not present

## 2024-03-20 DIAGNOSIS — G8929 Other chronic pain: Secondary | ICD-10-CM

## 2024-03-20 DIAGNOSIS — N1831 Chronic kidney disease, stage 3a: Secondary | ICD-10-CM

## 2024-03-20 DIAGNOSIS — M546 Pain in thoracic spine: Secondary | ICD-10-CM

## 2024-03-20 DIAGNOSIS — Z87442 Personal history of urinary calculi: Secondary | ICD-10-CM

## 2024-03-20 DIAGNOSIS — K862 Cyst of pancreas: Secondary | ICD-10-CM | POA: Diagnosis not present

## 2024-03-20 DIAGNOSIS — H6692 Otitis media, unspecified, left ear: Secondary | ICD-10-CM

## 2024-03-20 LAB — POCT URINALYSIS DIPSTICK
Bilirubin, UA: NEGATIVE
Blood, UA: NEGATIVE
Glucose, UA: NEGATIVE
Ketones, UA: NEGATIVE
Nitrite, UA: NEGATIVE
Protein, UA: POSITIVE — AB
Spec Grav, UA: 1.02
Urobilinogen, UA: 0.2 U/dL
pH, UA: 7

## 2024-03-20 MED ORDER — VALACYCLOVIR HCL 1 G PO TABS: 1000.0000 mg | ORAL_TABLET | Freq: Three times a day (TID) | ORAL | 0 refills | Status: AC

## 2024-03-20 MED ORDER — GABAPENTIN 100 MG PO CAPS: 100.0000 mg | ORAL_CAPSULE | Freq: Every day | ORAL | 2 refills | Status: AC

## 2024-03-20 MED ORDER — AMOXICILLIN-POT CLAVULANATE 875-125 MG PO TABS: 1.0000 | ORAL_TABLET | Freq: Two times a day (BID) | ORAL | 0 refills | Status: DC

## 2024-03-20 NOTE — Progress Notes (Signed)
 "  Established Patient Office Visit  Subjective:  Patient ID: Alicia Nelson, female    DOB: [birth year]  Age: 79 y.o. MRN: [id number]  Chief Complaint  Patient presents with   Follow-up    10 day follow up    She is here today for follow up. She reports she took the Steroids as prescribed without relief of her back pain.  Discussed recent xray results showed degenerative changes, mild scoliosis, and chronic compression fractures. There was also large staghorn calculus on the left with multiple additional smaller left kidney stones and Large calcified uterine fibroid. Will send patient to Urology and repeat CT abdomen from 2019 that found Several small pancreatic cysts measuring up to 8 mm in diameter that recommended 2 year FU. Will start Gabapentin  100 mg at bedtime for chronic back pain.  She reports the right side of her head hurts along her jaw line. Right eye is watering but denies eye pain or changes to her vision. Right ear reports decreased hearing but no ear pain. Denies popping sensation inside ear. Reports symptoms began today. She took OTC back and body that contains aspirin and caffeine this morning and it helped slightly. She reports taking this regularly for her back pain. She wears upper dentures and denies issues with denture pain or difficulty swallowing. There is no apparent rash at this time. Patient reports she has had 1 shingles shot but never finished the series. Educated patient to monitor for rash on her face and to begin taking Valtrex  as prescribed for suspected shingles. Discussed if she starts having eye pain or changes to her vision to go to the ED.       No other concerns at this time.   Past Medical History:  Diagnosis Date   History of kidney stones    Hypertension    Polyp in nasopharynx     Past Surgical History:  Procedure Laterality Date   CYSTOSCOPY W/ RETROGRADES Right 11/18/2017   Procedure: CYSTOSCOPY WITH RETROGRADE PYELOGRAM;  Surgeon:  Penne Knee, MD;  Location: [facility];  Service: Urology;  Laterality: Right;   CYSTOSCOPY/URETEROSCOPY/HOLMIUM LASER/STENT PLACEMENT Right 11/18/2017   Procedure: CYSTOSCOPY/URETEROSCOPY/HOLMIUM LASER/STENT PLACEMENT;  Surgeon: Penne Knee, MD;  Location: [facility];  Service: Urology;  Laterality: Right;   EXTRACORPOREAL SHOCK WAVE LITHOTRIPSY Right 11/07/2017   Procedure: EXTRACORPOREAL SHOCK WAVE LITHOTRIPSY (ESWL);  Surgeon: Penne Knee, MD;  Location: [facility];  Service: Urology;  Laterality: Right;   TUBAL LIGATION      Social History   Socioeconomic History   Marital status: Widowed    Spouse name: Not on file   Number of children: Not on file   Years of education: Not on file   Highest education level: Not on file  Occupational History   Not on file  Tobacco Use   Smoking status: Never   Smokeless tobacco: Never  Vaping Use   Vaping status: Never Used  Substance and Sexual Activity   Alcohol use: Never   Drug use: Never   Sexual activity: Not on file  Other Topics Concern   Not on file  Social History Narrative   Widowed.   1 child. 2 grandchildren, 2 great grandchildren.   Retired.    Enjoys exercising at the gym.    Social Drivers of Health   Tobacco Use: Low Risk (03/20/2024)   Patient History    Smoking Tobacco Use: Never    Smokeless Tobacco Use: Never    Passive Exposure: Not on file  Financial Resource Strain: Not on file  Food Insecurity: Not on file  Transportation Needs: Not on file  Physical Activity: Not on file  Stress: Not on file  Social Connections: Not on file  Intimate Partner Violence: Not on file  Depression (PHQ2-9): Low Risk (12/09/2023)   Depression (PHQ2-9)    PHQ-2 Score: 0  Alcohol Screen: Not on file  Housing: Not on file  Utilities: Not on file  Health Literacy: Not on file    Family History  Problem Relation Age of Onset   Diabetes Father    Colon cancer Father     Allergies[1]  Show/hide medication  list[2]  Review of Systems  Constitutional: Negative.  Negative for chills, fever and malaise/fatigue.  HENT: Negative.  Negative for congestion and sore throat.   Eyes: Negative.  Negative for blurred vision and pain.  Respiratory: Negative.  Negative for cough and shortness of breath.   Cardiovascular: Negative.  Negative for chest pain, palpitations and leg swelling.  Gastrointestinal: Negative.  Negative for abdominal pain, blood in stool, constipation, diarrhea, heartburn, melena, nausea and vomiting.  Genitourinary: Negative.  Negative for dysuria, flank pain, frequency and urgency.  Musculoskeletal:  Positive for back pain. Negative for joint pain and myalgias.  Skin: Negative.        Right side face/jaw painful; no apparent rash  Neurological: Negative.  Negative for dizziness, tingling, sensory change, weakness and headaches.  Endo/Heme/Allergies: Negative.   Psychiatric/Behavioral: Negative.  Negative for depression and suicidal ideas. The patient is not nervous/anxious.        Objective:   BP 108/78   Pulse (!) 105   Ht 5' 2 (1.575 m)   Wt 130 lb (59 kg)   SpO2 91%   BMI 23.78 kg/m   Vitals:   03/20/24 1438  BP: 108/78  Pulse: (!) 105  Height: 5' 2 (1.575 m)  Weight: 130 lb (59 kg)  SpO2: 91%  BMI (Calculated): 23.77    Physical Exam Vitals and nursing note reviewed.  Constitutional:      Appearance: Normal appearance.  HENT:     Head: Normocephalic and atraumatic.     Right Ear: Decreased hearing noted. A middle ear effusion is present.     Left Ear: Decreased hearing noted. Tympanic membrane is injected.     Ears:     Comments: Patient has hearing aides in today    Nose: Nose normal.     Mouth/Throat:     Mouth: Mucous membranes are moist.     Pharynx: Oropharynx is clear.  Eyes:     Conjunctiva/sclera: Conjunctivae normal.     Pupils: Pupils are equal, round, and reactive to light.  Cardiovascular:     Rate and Rhythm: Normal rate and regular  rhythm.     Pulses: Normal pulses.     Heart sounds: Normal heart sounds. No murmur heard. Pulmonary:     Effort: Pulmonary effort is normal.     Breath sounds: Normal breath sounds. No wheezing.  Abdominal:     General: Bowel sounds are normal.     Palpations: Abdomen is soft.     Tenderness: There is no abdominal tenderness. There is no right CVA tenderness or left CVA tenderness.  Musculoskeletal:        General: Normal range of motion.     Cervical back: Normal range of motion.     Right lower leg: No edema.     Left lower leg: No edema.  Skin:  General: Skin is warm and dry.  Neurological:     General: No focal deficit present.     Mental Status: She is alert and oriented to person, place, and time.  Psychiatric:        Mood and Affect: Mood normal.        Behavior: Behavior normal.      Results for orders placed or performed in visit on 03/20/24  POCT Urinalysis Dipstick (81002)  Result Value Ref Range   Color, UA Yellow    Clarity, UA Cloudy    Glucose, UA Negative Negative   Bilirubin, UA Negative    Ketones, UA Negative    Spec Grav, UA 1.020 1.010 - 1.025   Blood, UA Negative    pH, UA 7.0 5.0 - 8.0   Protein, UA Positive (A) Negative   Urobilinogen, UA 0.2 0.2 or 1.0 E.U./dL   Nitrite, UA Negative    Leukocytes, UA Small (1+) (A) Negative   Appearance Cloudy    Odor Yes     Recent Results (from the past 2160 hours)  POC CREATINE & ALBUMIN,URINE     Status: Abnormal   Collection Time: 03/10/24  1:53 PM  Result Value Ref Range   Microalbumin Ur, POC 30 mg/L   Creatinine, POC 50 mg/dL   Albumin/Creatinine Ratio, Urine, POC 30-300   CMP14+EGFR     Status: Abnormal   Collection Time: 03/10/24  2:17 PM  Result Value Ref Range   Glucose 87 70 - 99 mg/dL   BUN 13 8 - 27 mg/dL   Creatinine, Ser 9.00 0.57 - 1.00 mg/dL   eGFR 58 (L) >40 fO/fpw/8.26   BUN/Creatinine Ratio 13 12 - 28   Sodium 143 134 - 144 mmol/L   Potassium 4.1 3.5 - 5.2 mmol/L    Chloride 102 96 - 106 mmol/L   CO2 26 20 - 29 mmol/L   Calcium  10.0 8.7 - 10.3 mg/dL   Total Protein 6.2 6.0 - 8.5 g/dL   Albumin 4.5 3.8 - 4.8 g/dL   Globulin, Total 1.7 1.5 - 4.5 g/dL   Bilirubin Total 0.9 0.0 - 1.2 mg/dL   Alkaline Phosphatase 58 49 - 135 IU/L   AST 18 0 - 40 IU/L   ALT 18 0 - 32 IU/L  Lipid Panel w/o Chol/HDL Ratio     Status: Abnormal   Collection Time: 03/10/24  2:17 PM  Result Value Ref Range   Cholesterol, Total 155 100 - 199 mg/dL   Triglycerides 832 (H) 0 - 149 mg/dL   HDL 55 >60 mg/dL   VLDL Cholesterol Cal 28 5 - 40 mg/dL   LDL Chol Calc (NIH) 72 0 - 99 mg/dL  CBC with Diff     Status: None   Collection Time: 03/10/24  2:17 PM  Result Value Ref Range   WBC 5.7 3.4 - 10.8 x10E3/uL   RBC 4.55 3.77 - 5.28 x10E6/uL   Hemoglobin 14.7 11.1 - 15.9 g/dL   Hematocrit 56.1 65.9 - 46.6 %   MCV 96 79 - 97 fL   MCH 32.3 26.6 - 33.0 pg   MCHC 33.6 31.5 - 35.7 g/dL   RDW 87.3 88.2 - 84.5 %   Platelets 152 150 - 450 x10E3/uL   Neutrophils 63 Not Estab. %   Lymphs 24 Not Estab. %   Monocytes 10 Not Estab. %   Eos 3 Not Estab. %   Basos 0 Not Estab. %   Neutrophils Absolute 3.6 1.4 - 7.0  x10E3/uL   Lymphocytes Absolute 1.4 0.7 - 3.1 x10E3/uL   Monocytes Absolute 0.6 0.1 - 0.9 x10E3/uL   EOS (ABSOLUTE) 0.1 0.0 - 0.4 x10E3/uL   Basophils Absolute 0.0 0.0 - 0.2 x10E3/uL   Immature Granulocytes 0 Not Estab. %   Immature Grans (Abs) 0.0 0.0 - 0.1 x10E3/uL  Hemoglobin A1c     Status: Abnormal   Collection Time: 03/10/24  2:17 PM  Result Value Ref Range   Hgb A1c MFr Bld 6.0 (H) 4.8 - 5.6 %    Comment:          Prediabetes: 5.7 - 6.4          Diabetes: >6.4          Glycemic control for adults with diabetes: <7.0    Est. average glucose Bld gHb Est-mCnc 126 mg/dL  POCT Urinalysis Dipstick (18997)     Status: Abnormal   Collection Time: 03/20/24  2:58 PM  Result Value Ref Range   Color, UA Yellow    Clarity, UA Cloudy    Glucose, UA Negative Negative    Bilirubin, UA Negative    Ketones, UA Negative    Spec Grav, UA 1.020 1.010 - 1.025   Blood, UA Negative    pH, UA 7.0 5.0 - 8.0   Protein, UA Positive (A) Negative   Urobilinogen, UA 0.2 0.2 or 1.0 E.U./dL   Nitrite, UA Negative    Leukocytes, UA Small (1+) (A) Negative   Appearance Cloudy    Odor Yes       Assessment & Plan:  Start Augmentin  for ear infection. Start Valtrex  for suspected shingles. Start Gabapentin  for chronic back pain. CT abdomen ordered. UA collected. Showed pus cells. Urology referral sent. Problem List Items Addressed This Visit       Cardiovascular and Mediastinum   Essential hypertension, benign - Primary     Digestive   Pancreatic cyst   Relevant Orders   CT ABDOMEN PELVIS W CONTRAST     Nervous and Auditory   Acute left otitis media   Relevant Medications   valACYclovir  (VALTREX ) 1000 MG tablet   amoxicillin -clavulanate (AUGMENTIN ) 875-125 MG tablet     Musculoskeletal and Integument   Osteoporosis without current pathological fracture     Genitourinary   CKD (chronic kidney disease) stage 3, GFR 30-59 ml/min (HCC)     Other   History of kidney stones   Relevant Orders   POCT Urinalysis Dipstick (18997) (Completed)   Ambulatory referral to Urology   Hyperlipidemia   Chronic midline thoracic back pain   Relevant Medications   gabapentin  (NEURONTIN ) 100 MG capsule   Prediabetes   Right facial pain   Relevant Medications   valACYclovir  (VALTREX ) 1000 MG tablet    Return in about 10 days (around 03/30/2024).   Total time spent: 30 minutes. This time includes review of previous notes and results and patient face to face interaction during today's visit.    FERNAND FREDY RAMAN, MD  03/20/2024   This document may have been prepared by [facility] Voice Recognition software and as such may include unintentional dictation errors.     [1] No Known Allergies [2]  Outpatient Medications Prior to Visit  Medication Sig   Acetaminophen  (TYLENOL   ARTHRITIS PAIN PO) Take by mouth.   alendronate  (FOSAMAX ) 70 MG tablet TAKE 1 TABLET (70 MG TOTAL) BY MOUTH ONCE A WEEK   calcium  carbonate (OS-CAL - DOSED IN MG OF ELEMENTAL CALCIUM ) 1250 (500 Ca) MG tablet Take 1  tablet by mouth 2 (two) times daily.   lisinopril  (ZESTRIL ) 20 MG tablet TAKE 1 TABLET BY MOUTH DAILY FOR BLOOD PRESSURE   Multiple Vitamin (MULTIVITAMIN WITH MINERALS) TABS tablet Take 1 tablet by mouth daily.   rosuvastatin  (CRESTOR ) 20 MG tablet Take 1 tablet (20 mg total) by mouth daily.   vitamin C (ASCORBIC ACID) 500 MG tablet Take 1,000 mg by mouth 2 (two) times daily.   [DISCONTINUED] predniSONE  (DELTASONE ) 20 MG tablet Take 2 tablets (40 mg total) by mouth daily with breakfast. (Patient not taking: Reported on 03/20/2024)   No facility-administered medications prior to visit.   "

## 2024-03-23 LAB — URINE CULTURE

## 2024-03-31 ENCOUNTER — Encounter: Payer: Self-pay | Admitting: Internal Medicine

## 2024-03-31 ENCOUNTER — Ambulatory Visit: Payer: Self-pay | Admitting: Internal Medicine

## 2024-03-31 ENCOUNTER — Ambulatory Visit: Admitting: Internal Medicine

## 2024-03-31 VITALS — BP 100/74 | HR 111 | Ht 62.0 in | Wt 127.8 lb

## 2024-03-31 DIAGNOSIS — E782 Mixed hyperlipidemia: Secondary | ICD-10-CM

## 2024-03-31 DIAGNOSIS — N3000 Acute cystitis without hematuria: Secondary | ICD-10-CM | POA: Diagnosis not present

## 2024-03-31 DIAGNOSIS — R7303 Prediabetes: Secondary | ICD-10-CM

## 2024-03-31 DIAGNOSIS — N200 Calculus of kidney: Secondary | ICD-10-CM | POA: Diagnosis not present

## 2024-03-31 DIAGNOSIS — I10 Essential (primary) hypertension: Secondary | ICD-10-CM

## 2024-03-31 DIAGNOSIS — G8929 Other chronic pain: Secondary | ICD-10-CM

## 2024-03-31 DIAGNOSIS — M546 Pain in thoracic spine: Secondary | ICD-10-CM

## 2024-03-31 LAB — POCT URINALYSIS DIPSTICK
Bilirubin, UA: NEGATIVE
Blood, UA: NEGATIVE
Glucose, UA: NEGATIVE
Ketones, UA: NEGATIVE
Nitrite, UA: NEGATIVE
Protein, UA: POSITIVE — AB
Spec Grav, UA: 1.015
Urobilinogen, UA: 0.2 U/dL
pH, UA: 6

## 2024-03-31 MED ORDER — NITROFURANTOIN MONOHYD MACRO 100 MG PO CAPS: 100.0000 mg | ORAL_CAPSULE | Freq: Two times a day (BID) | ORAL | 0 refills | Status: AC

## 2024-03-31 NOTE — Progress Notes (Signed)
 "  Established Patient Office Visit  Subjective:  Patient ID: Alicia Nelson, female    DOB: May 12, 1945  Age: 79 y.o. MRN: [id number]  Chief Complaint  Patient presents with   Follow-up    10 day follow up    Patient comes in for follow-up accompanied by family member.  She states that she is not feeling well today with some lower abdominal discomfort.  At her last visit she was evaluated for lower back pain and the x-ray showed a large staghorn calculus in the left kidney along with multiple smaller stones.  Also seen incidentally was a large calcified fibroid in the uterus.  Her urine was positive for pus cells and the culture showed E.Coli  growth.  Patient was treated with Augmentin .  A CT renal protocol was ordered as well as a urology consultation, but they have not heard from them yet.  Her repeat urine today has even more leukocytes, and she is also complaining of mild diarrhea due to antibiotic.  Will send it for culture again. Previously patient was complaining of right-sided facial pain, she she was started on Valtrex , the pain has resolved and she did not see any rash.  Thinks it was most probably sinus infection and Augmentin  helped it. She is tolerating gabapentin  for her lower back pain and the radicular pain to the right leg has resolved.  Advised to continue gabapentin  at night.    No other concerns at this time.   Past Medical History:  Diagnosis Date   History of kidney stones    Hypertension    Polyp in nasopharynx     Past Surgical History:  Procedure Laterality Date   CYSTOSCOPY W/ RETROGRADES Right 11/18/2017   Procedure: CYSTOSCOPY WITH RETROGRADE PYELOGRAM;  Surgeon: Penne Knee, MD;  Location: [facility];  Service: Urology;  Laterality: Right;   CYSTOSCOPY/URETEROSCOPY/HOLMIUM LASER/STENT PLACEMENT Right 11/18/2017   Procedure: CYSTOSCOPY/URETEROSCOPY/HOLMIUM LASER/STENT PLACEMENT;  Surgeon: Penne Knee, MD;  Location: [facility];  Service: Urology;   Laterality: Right;   EXTRACORPOREAL SHOCK WAVE LITHOTRIPSY Right 11/07/2017   Procedure: EXTRACORPOREAL SHOCK WAVE LITHOTRIPSY (ESWL);  Surgeon: Penne Knee, MD;  Location: [facility];  Service: Urology;  Laterality: Right;   TUBAL LIGATION      Social History   Socioeconomic History   Marital status: Widowed    Spouse name: Not on file   Number of children: Not on file   Years of education: Not on file   Highest education level: Not on file  Occupational History   Not on file  Tobacco Use   Smoking status: Never   Smokeless tobacco: Never  Vaping Use   Vaping status: Never Used  Substance and Sexual Activity   Alcohol use: Never   Drug use: Never   Sexual activity: Not on file  Other Topics Concern   Not on file  Social History Narrative   Widowed.   1 child. 2 grandchildren, 2 great grandchildren.   Retired.    Enjoys exercising at the gym.    Social Drivers of Health   Tobacco Use: Low Risk (03/31/2024)   Patient History    Smoking Tobacco Use: Never    Smokeless Tobacco Use: Never    Passive Exposure: Not on file  Financial Resource Strain: Not on file  Food Insecurity: Not on file  Transportation Needs: Not on file  Physical Activity: Not on file  Stress: Not on file  Social Connections: Not on file  Intimate Partner Violence: Not on file  Depression (PHQ2-9): Low Risk (12/09/2023)   Depression (PHQ2-9)    PHQ-2 Score: 0  Alcohol Screen: Not on file  Housing: Not on file  Utilities: Not on file  Health Literacy: Not on file    Family History  Problem Relation Age of Onset   Diabetes Father    Colon cancer Father     Allergies[1]  Show/hide medication list[2]  Review of Systems  Constitutional: Negative.  Negative for chills, fever and malaise/fatigue.  HENT: Negative.  Negative for congestion and sore throat.   Eyes: Negative.  Negative for blurred vision and pain.  Respiratory: Negative.  Negative for cough and shortness of breath.    Cardiovascular: Negative.  Negative for chest pain, palpitations and leg swelling.  Gastrointestinal: Negative.  Negative for abdominal pain, blood in stool, constipation, diarrhea, heartburn, melena, nausea and vomiting.  Genitourinary: Negative.  Negative for dysuria, flank pain, frequency and urgency.  Musculoskeletal:  Positive for back pain. Negative for joint pain and myalgias.  Skin: Negative.   Neurological: Negative.  Negative for dizziness, tingling, sensory change, weakness and headaches.  Endo/Heme/Allergies: Negative.   Psychiatric/Behavioral: Negative.  Negative for depression and suicidal ideas. The patient is not nervous/anxious.        Objective:   BP 100/74   Pulse (!) 111   Ht 5' 2 (1.575 m)   Wt 127 lb 12.8 oz (58 kg)   SpO2 95%   BMI 23.37 kg/m   Vitals:   03/31/24 1333  BP: 100/74  Pulse: (!) 111  Height: 5' 2 (1.575 m)  Weight: 127 lb 12.8 oz (58 kg)  SpO2: 95%  BMI (Calculated): 23.37    Physical Exam Vitals and nursing note reviewed.  Constitutional:      Appearance: Normal appearance.  HENT:     Head: Normocephalic and atraumatic.     Nose: Nose normal.     Mouth/Throat:     Mouth: Mucous membranes are moist.     Pharynx: Oropharynx is clear.  Eyes:     Conjunctiva/sclera: Conjunctivae normal.     Pupils: Pupils are equal, round, and reactive to light.  Cardiovascular:     Rate and Rhythm: Normal rate and regular rhythm.     Pulses: Normal pulses.     Heart sounds: Normal heart sounds. No murmur heard. Pulmonary:     Effort: Pulmonary effort is normal.     Breath sounds: Normal breath sounds. No wheezing.  Abdominal:     General: Bowel sounds are normal.     Palpations: Abdomen is soft.     Tenderness: There is no abdominal tenderness. There is no right CVA tenderness or left CVA tenderness.  Musculoskeletal:        General: Normal range of motion.     Cervical back: Normal range of motion.     Right lower leg: No edema.      Left lower leg: No edema.  Skin:    General: Skin is warm and dry.  Neurological:     General: No focal deficit present.     Mental Status: She is alert and oriented to person, place, and time.  Psychiatric:        Mood and Affect: Mood normal.        Behavior: Behavior normal.      Results for orders placed or performed in visit on 03/31/24  POCT Urinalysis Dipstick (81002)  Result Value Ref Range   Color, UA Yellow    Clarity, UA Cloudy  Glucose, UA Negative Negative   Bilirubin, UA Negative    Ketones, UA Negative    Spec Grav, UA 1.015 1.010 - 1.025   Blood, UA Negative    pH, UA 6.0 5.0 - 8.0   Protein, UA Positive (A) Negative   Urobilinogen, UA 0.2 0.2 or 1.0 E.U./dL   Nitrite, UA Negative    Leukocytes, UA Large (3+) (A) Negative   Appearance Cloudy    Odor Yes     Recent Results (from the past 2160 hours)  POC CREATINE & ALBUMIN,URINE     Status: Abnormal   Collection Time: 03/10/24  1:53 PM  Result Value Ref Range   Microalbumin Ur, POC 30 mg/L   Creatinine, POC 50 mg/dL   Albumin/Creatinine Ratio, Urine, POC 30-300   CMP14+EGFR     Status: Abnormal   Collection Time: 03/10/24  2:17 PM  Result Value Ref Range   Glucose 87 70 - 99 mg/dL   BUN 13 8 - 27 mg/dL   Creatinine, Ser 9.00 0.57 - 1.00 mg/dL   eGFR 58 (L) >40 fO/fpw/8.26   BUN/Creatinine Ratio 13 12 - 28   Sodium 143 134 - 144 mmol/L   Potassium 4.1 3.5 - 5.2 mmol/L   Chloride 102 96 - 106 mmol/L   CO2 26 20 - 29 mmol/L   Calcium  10.0 8.7 - 10.3 mg/dL   Total Protein 6.2 6.0 - 8.5 g/dL   Albumin 4.5 3.8 - 4.8 g/dL   Globulin, Total 1.7 1.5 - 4.5 g/dL   Bilirubin Total 0.9 0.0 - 1.2 mg/dL   Alkaline Phosphatase 58 49 - 135 IU/L   AST 18 0 - 40 IU/L   ALT 18 0 - 32 IU/L  Lipid Panel w/o Chol/HDL Ratio     Status: Abnormal   Collection Time: 03/10/24  2:17 PM  Result Value Ref Range   Cholesterol, Total 155 100 - 199 mg/dL   Triglycerides 832 (H) 0 - 149 mg/dL   HDL 55 >60 mg/dL   VLDL  Cholesterol Cal 28 5 - 40 mg/dL   LDL Chol Calc (NIH) 72 0 - 99 mg/dL  CBC with Diff     Status: None   Collection Time: 03/10/24  2:17 PM  Result Value Ref Range   WBC 5.7 3.4 - 10.8 x10E3/uL   RBC 4.55 3.77 - 5.28 x10E6/uL   Hemoglobin 14.7 11.1 - 15.9 g/dL   Hematocrit 56.1 65.9 - 46.6 %   MCV 96 79 - 97 fL   MCH 32.3 26.6 - 33.0 pg   MCHC 33.6 31.5 - 35.7 g/dL   RDW 87.3 88.2 - 84.5 %   Platelets 152 150 - 450 x10E3/uL   Neutrophils 63 Not Estab. %   Lymphs 24 Not Estab. %   Monocytes 10 Not Estab. %   Eos 3 Not Estab. %   Basos 0 Not Estab. %   Neutrophils Absolute 3.6 1.4 - 7.0 x10E3/uL   Lymphocytes Absolute 1.4 0.7 - 3.1 x10E3/uL   Monocytes Absolute 0.6 0.1 - 0.9 x10E3/uL   EOS (ABSOLUTE) 0.1 0.0 - 0.4 x10E3/uL   Basophils Absolute 0.0 0.0 - 0.2 x10E3/uL   Immature Granulocytes 0 Not Estab. %   Immature Grans (Abs) 0.0 0.0 - 0.1 x10E3/uL  Hemoglobin A1c     Status: Abnormal   Collection Time: 03/10/24  2:17 PM  Result Value Ref Range   Hgb A1c MFr Bld 6.0 (H) 4.8 - 5.6 %    Comment:  Prediabetes: 5.7 - 6.4          Diabetes: >6.4          Glycemic control for adults with diabetes: <7.0    Est. average glucose Bld gHb Est-mCnc 126 mg/dL  POCT Urinalysis Dipstick (18997)     Status: Abnormal   Collection Time: 03/20/24  2:58 PM  Result Value Ref Range   Color, UA Yellow    Clarity, UA Cloudy    Glucose, UA Negative Negative   Bilirubin, UA Negative    Ketones, UA Negative    Spec Grav, UA 1.020 1.010 - 1.025   Blood, UA Negative    pH, UA 7.0 5.0 - 8.0   Protein, UA Positive (A) Negative   Urobilinogen, UA 0.2 0.2 or 1.0 E.U./dL   Nitrite, UA Negative    Leukocytes, UA Small (1+) (A) Negative   Appearance Cloudy    Odor Yes   Urine Culture     Status: Abnormal   Collection Time: 03/20/24  4:01 PM   Specimen: Urine   UR  Result Value Ref Range   Urine Culture, Routine Final report (A)    Organism ID, Bacteria Escherichia coli (A)     Comment:  Cefazolin  with an MIC <=16 predicts susceptibility to the oral agents cefaclor, cefdinir, cefpodoxime, cefprozil, cefuroxime, cephalexin, and loracarbef when used for therapy of uncomplicated urinary tract infections due to E. coli, Klebsiella pneumoniae, and Proteus mirabilis. 25,000-50,000 colony forming units per mL    ORGANISM ID, BACTERIA Comment (A)     Comment: Streptococcus mitis/oralis group 50,000-100,000 colony forming units per mL Susceptibility not normally performed on this organism.    Antimicrobial Susceptibility Comment     Comment:       ** S = Susceptible; I = Intermediate; R = Resistant **                    P = Positive; N = Negative             MICS are expressed in micrograms per mL    Antibiotic                 RSLT#1    RSLT#2    RSLT#3    RSLT#4 Amoxicillin /Clavulanic Acid    S Ampicillin                     I Cefazolin                       S Cefepime                       S Cefoxitin                      S Cefpodoxime                    S Ceftriaxone                    S Ciprofloxacin                   S Ertapenem                      S Gentamicin                     S Levofloxacin  S Meropenem                      S Nitrofurantoin                  S Piperacillin/Tazobactam        S Tetracycline                   S Tobramycin                     S Trimethoprim/Sulfa             S   POCT Urinalysis Dipstick (18997)     Status: Abnormal   Collection Time: 03/31/24  1:39 PM  Result Value Ref Range   Color, UA Yellow    Clarity, UA Cloudy    Glucose, UA Negative Negative   Bilirubin, UA Negative    Ketones, UA Negative    Spec Grav, UA 1.015 1.010 - 1.025   Blood, UA Negative    pH, UA 6.0 5.0 - 8.0   Protein, UA Positive (A) Negative   Urobilinogen, UA 0.2 0.2 or 1.0 E.U./dL   Nitrite, UA Negative    Leukocytes, UA Large (3+) (A) Negative   Appearance Cloudy    Odor Yes       Assessment & Plan:  Send urine for culture  again.  Add Macrobid .  Continue gabapentin .  Waiting for CT renal protocol and urology consultation.  Advised to drink extra water.  Also add probiotics. Problem List Items Addressed This Visit       Cardiovascular and Mediastinum   Essential hypertension, benign - Primary     Other   Hyperlipidemia   Chronic midline thoracic back pain   Relevant Orders   POCT Urinalysis Dipstick (18997) (Completed)   Prediabetes   Other Visit Diagnoses       Kidney stones       Relevant Medications   nitrofurantoin , macrocrystal-monohydrate, (MACROBID ) 100 MG capsule   Other Relevant Orders   Urine Culture   Urinalysis     Acute cystitis without hematuria       Relevant Medications   nitrofurantoin , macrocrystal-monohydrate, (MACROBID ) 100 MG capsule   Other Relevant Orders   Urine Culture       Return in about 2 weeks (around 04/14/2024).   Total time spent: 30 minutes. This time includes review of previous notes and results and patient face to face interaction during today's visit.    FERNAND FREDY RAMAN, MD  03/31/2024   This document may have been prepared by [facility] Voice Recognition software and as such may include unintentional dictation errors.     [1] No Known Allergies [2]  Outpatient Medications Prior to Visit  Medication Sig   Acetaminophen  (TYLENOL  ARTHRITIS PAIN PO) Take by mouth.   alendronate  (FOSAMAX ) 70 MG tablet TAKE 1 TABLET (70 MG TOTAL) BY MOUTH ONCE A WEEK   calcium  carbonate (OS-CAL - DOSED IN MG OF ELEMENTAL CALCIUM ) 1250 (500 Ca) MG tablet Take 1 tablet by mouth 2 (two) times daily.   gabapentin  (NEURONTIN ) 100 MG capsule Take 1 capsule (100 mg total) by mouth daily.   lisinopril  (ZESTRIL ) 20 MG tablet TAKE 1 TABLET BY MOUTH DAILY FOR BLOOD PRESSURE   Multiple Vitamin (MULTIVITAMIN WITH MINERALS) TABS tablet Take 1 tablet by mouth daily.   rosuvastatin  (CRESTOR ) 20 MG tablet Take 1 tablet (20 mg total) by mouth daily.   vitamin C (ASCORBIC ACID) 500 MG  tablet  Take 1,000 mg by mouth 2 (two) times daily.   [DISCONTINUED] amoxicillin -clavulanate (AUGMENTIN ) 875-125 MG tablet Take 1 tablet by mouth 2 (two) times daily. (Patient not taking: Reported on 03/31/2024)   No facility-administered medications prior to visit.   "

## 2024-04-02 ENCOUNTER — Ambulatory Visit
Admission: RE | Admit: 2024-04-02 | Discharge: 2024-04-02 | Disposition: A | Discharge status: Home / Self Care | Source: Ambulatory Visit | Attending: Internal Medicine | Admitting: Internal Medicine

## 2024-04-02 DIAGNOSIS — K862 Cyst of pancreas: Secondary | ICD-10-CM | POA: Diagnosis present

## 2024-04-02 MED ORDER — IOHEXOL 300 MG/ML  SOLN
85.0000 mL | Freq: Once | INTRAMUSCULAR | Status: AC | PRN
  Administered 2024-04-02: 85 mL via INTRAVENOUS

## 2024-04-04 LAB — URINE CULTURE

## 2024-04-07 DIAGNOSIS — N200 Calculus of kidney: Secondary | ICD-10-CM | POA: Insufficient documentation

## 2024-04-07 NOTE — Progress Notes (Unsigned)
 "  04/10/24 9:22 AM   Alicia Nelson [birth year] [id number]   HPI: 79 y.o. female here for initial evaluation of nephrolithiasis  Saw Dr. Penne in 2019 for stones  - s/p Right URS/LL in 2019, ~67mm UPJ stone  CT A/P 04/02/24 - 2.7 cm Left UPJ staghorn with moderate hydronephrosis + multiple moderate to large scattered calyceal stones  Urine culture 03/31/24- [facility]  Companied by her daughter today Denies any left flank pain Has chronic right lower back and right flank pain Denies any acute LUTS No blood thinners Last anesthesia was in 2019      PMH: Past Medical History:  Diagnosis Date   History of kidney stones    Hypertension    Polyp in nasopharynx     Surgical History: Past Surgical History:  Procedure Laterality Date   CYSTOSCOPY W/ RETROGRADES Right 11/18/2017   Procedure: CYSTOSCOPY WITH RETROGRADE PYELOGRAM;  Surgeon: Penne Knee, MD;  Location: [facility];  Service: Urology;  Laterality: Right;   CYSTOSCOPY/URETEROSCOPY/HOLMIUM LASER/STENT PLACEMENT Right 11/18/2017   Procedure: CYSTOSCOPY/URETEROSCOPY/HOLMIUM LASER/STENT PLACEMENT;  Surgeon: Penne Knee, MD;  Location: [facility];  Service: Urology;  Laterality: Right;   EXTRACORPOREAL SHOCK WAVE LITHOTRIPSY Right 11/07/2017   Procedure: EXTRACORPOREAL SHOCK WAVE LITHOTRIPSY (ESWL);  Surgeon: Penne Knee, MD;  Location: [facility];  Service: Urology;  Laterality: Right;   TUBAL LIGATION      Family History: Family History  Problem Relation Age of Onset   Diabetes Father    Colon cancer Father     Social History:  reports that she has never smoked. She has never used smokeless tobacco. She reports that she does not drink alcohol and does not use drugs.      Physical Exam: BP 137/87 (BP Location: Left Arm, Patient Position: Sitting, Cuff Size: Normal)   Pulse 91   Ht 5' 2 (1.575 m)   Wt 125 lb (56.7 kg)   SpO2 99%   BMI 22.86 kg/m    Constitutional:  Alert and oriented, No acute  distress. Cardiovascular: No clubbing, cyanosis, or edema. Respiratory: Normal respiratory effort, no increased work of breathing. GI: Nondistended Skin: No rashes, bruises or suspicious lesions. Neurologic: Grossly intact, no focal deficits, moving all 4 extremities. Psychiatric: Normal mood and affect.  Laboratory Data: Creatinine 0.99 (03/10/2024)   Pertinent Imaging: I have personally viewed and interpreted the CT A/P 04/02/24 - 2.7 cm Left UPJ staghorn with mild to moderate left pelviectasis, scattered calyceal stones measuring up to 5-7 mm.  No contralateral right renal stones, no right hydronephrosis.  IMPRESSION: 1. 2.7 cm staghorn calculus at the left UPJ, with moderate left hydronephrosis. Numerous other nonobstructing left renal calculi as above. 2. Large hiatal hernia containing the majority of the stomach. 3. Multiple simple appearing pancreatic cysts, measuring up to 1.4 cm in size, slightly increased since prior study. If not previously performed, and if the patient would be a therapy candidate should neoplasm be detected, a follow-up dedicated pancreatic protocol MRI could be considered. 4. 2.5 cm simple appearing right adnexal cyst. No follow-up imaging recommended. Note: This recommendation does not apply to premenarchal patients and to those with increased risk (genetic, family history, elevated tumor markers or other high-risk factors) of ovarian cancer. Reference: JACR 2020 Feb; 17(2):248-254 5. Stable fibroid uterus. 6. Distal colonic diverticulosis without diverticulitis. 7. Left adrenal mass measuring 1 cm, probable benign adenoma. Recommend follow-up adrenal washout CT in 1 year. If stable for = 1 year, no further follow-up imaging. JACR 2017 Aug; 14(8):1038-44,  JCAT 2016 Mar-Apr; 40(2):194-200, Urol J 2006 Spring; 3(2):71-4.SABRA    Assessment & Plan:    Nephrolithiasis Assessment & Plan: 2.7 cm Left UPJ staghorn with moderate hydronephrosis + multiple  moderate to large scattered calyceal stones (dx Jan 2026)   - Asymptomatic, negative recent urine culture  Reviewed her clinical history and recent CT scan.  She has very large burden left-sided nephrolithiasis with a partial staghorn, and likely subacute on chronic partial obstruction with mild to moderate pelviectasis.  Fortunately, remains asymptomatic with a recent negative urine culture.  I reviewed her imaging with her and her daughter today.  Considering her stone burden, I would not pursue retrograde ureteroscopy which would likely require multiple staged procedures and anesthetics.  I do think she would be a reasonable candidate for a PCNL.  Described this procedure in detail today.  I do not perform this at [facility] but would be happy to refer her to one of my colleagues at [facility].   - Refer to [facility] urology for evaluation for left PCNL       Penne Skye, MD 04/10/2024  [facility] [address] [place], [place] [address] [phone] "

## 2024-04-07 NOTE — Assessment & Plan Note (Signed)
 2.7 cm Left UPJ staghorn with moderate hydronephrosis + multiple moderate to large scattered calyceal stones (dx Jan 2026)   - Asymptomatic, negative recent urine culture  Reviewed her clinical history and recent CT scan.  She has very large burden left-sided nephrolithiasis with a partial staghorn, and likely subacute on chronic partial obstruction with mild to moderate pelviectasis.  Fortunately, remains asymptomatic with a recent negative urine culture.  I reviewed her imaging with her and her daughter today.  Considering her stone burden, I would not pursue retrograde ureteroscopy which would likely require multiple staged procedures and anesthetics.  I do think she would be a reasonable candidate for a PCNL.  Described this procedure in detail today.  I do not perform this at [facility] but would be happy to refer her to one of my colleagues at [facility].   - Refer to [facility] urology for evaluation for left PCNL

## 2024-04-07 NOTE — Progress Notes (Signed)
 Patient notified

## 2024-04-10 ENCOUNTER — Ambulatory Visit: Admitting: Urology

## 2024-04-10 VITALS — BP 137/87 | HR 91 | Ht 62.0 in | Wt 125.0 lb

## 2024-04-10 DIAGNOSIS — N200 Calculus of kidney: Secondary | ICD-10-CM

## 2024-04-14 ENCOUNTER — Ambulatory Visit: Admitting: Internal Medicine
